# Patient Record
Sex: Male | Born: 1985 | Race: Black or African American | Hispanic: No | Marital: Single | Smoking: Never smoker
Health system: Southern US, Community
[De-identification: ages and names within clinical notes are randomized; demographics above are authoritative.]

## PROBLEM LIST (undated history)

## (undated) DIAGNOSIS — R569 Unspecified convulsions: Secondary | ICD-10-CM

## (undated) DIAGNOSIS — I10 Essential (primary) hypertension: Secondary | ICD-10-CM

## (undated) HISTORY — DX: Unspecified convulsions: R56.9

## (undated) HISTORY — DX: Essential (primary) hypertension: I10

## (undated) HISTORY — PX: OTHER SURGICAL HISTORY: SHX169

---
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
## 2005-09-20 ENCOUNTER — Emergency Department (HOSPITAL_COMMUNITY): Admission: EM | Admit: 2005-09-20 | Discharge: 2005-09-20 | Payer: Self-pay | Admitting: Emergency Medicine

## 2005-10-02 ENCOUNTER — Emergency Department (HOSPITAL_COMMUNITY): Admission: EM | Admit: 2005-10-02 | Discharge: 2005-10-02 | Payer: Self-pay | Admitting: Emergency Medicine

## 2006-02-06 ENCOUNTER — Emergency Department (HOSPITAL_COMMUNITY): Admission: EM | Admit: 2006-02-06 | Discharge: 2006-02-06 | Payer: Self-pay | Admitting: Emergency Medicine

## 2010-07-24 ENCOUNTER — Emergency Department (HOSPITAL_COMMUNITY)
Admission: EM | Admit: 2010-07-24 | Discharge: 2010-07-24 | Disposition: A | Discharge status: Home / Self Care | Payer: BC Managed Care – PPO | Attending: Emergency Medicine | Admitting: Emergency Medicine

## 2010-07-24 DIAGNOSIS — F29 Unspecified psychosis not due to a substance or known physiological condition: Secondary | ICD-10-CM | POA: Insufficient documentation

## 2010-07-24 DIAGNOSIS — G40909 Epilepsy, unspecified, not intractable, without status epilepticus: Secondary | ICD-10-CM | POA: Insufficient documentation

## 2010-07-24 LAB — VALPROIC ACID LEVEL: Valproic Acid Lvl: 36.4 ug/mL — ABNORMAL LOW (ref 50.0–100.0)

## 2011-05-31 ENCOUNTER — Emergency Department (HOSPITAL_COMMUNITY)
Admission: EM | Admit: 2011-05-31 | Discharge: 2011-05-31 | Disposition: A | Discharge status: Home / Self Care | Payer: BC Managed Care – PPO | Attending: Emergency Medicine | Admitting: Emergency Medicine

## 2011-05-31 ENCOUNTER — Emergency Department (HOSPITAL_COMMUNITY): Payer: BC Managed Care – PPO

## 2011-05-31 DIAGNOSIS — IMO0001 Reserved for inherently not codable concepts without codable children: Secondary | ICD-10-CM | POA: Insufficient documentation

## 2011-05-31 DIAGNOSIS — X500XXA Overexertion from strenuous movement or load, initial encounter: Secondary | ICD-10-CM | POA: Insufficient documentation

## 2011-05-31 DIAGNOSIS — R071 Chest pain on breathing: Secondary | ICD-10-CM | POA: Insufficient documentation

## 2011-05-31 DIAGNOSIS — R0789 Other chest pain: Secondary | ICD-10-CM

## 2011-05-31 MED ORDER — HYDROCODONE-ACETAMINOPHEN 5-325 MG PO TABS: ORAL_TABLET | ORAL | Status: AC

## 2011-05-31 MED ORDER — IBUPROFEN 800 MG PO TABS: 800.0000 mg | ORAL_TABLET | Freq: Three times a day (TID) | ORAL | Status: AC | PRN

## 2011-05-31 MED ORDER — IBUPROFEN 800 MG PO TABS
800.0000 mg | ORAL_TABLET | Freq: Once | ORAL | Status: AC
  Administered 2011-05-31: 800 mg via ORAL
  Filled 2011-05-31: qty 1

## 2011-05-31 NOTE — Discharge Instructions (Signed)
Please read and follow all provided instructions.  Your diagnoses today include:  1. Chest wall pain     Tests performed today include:  X-ray of shoulder - normal  Vital signs. See below for your results today.   Medications prescribed:   Vicodin (hydrocodone/acetaminophen) - narcotic pain medication  You have been prescribed narcotic pain medication such as Vicodin or Percocet: DO NOT drive or perform any activities that require you to be awake and alert because this medicine can make you drowsy. BE VERY CAREFUL not to take multiple medicines containing Tylenol (also called acetaminophen). Doing so can lead to an overdose which can damage your liver and cause liver failure and possibly death.    Ibuprofen - anti-inflammatory pain medication  Do not exceed 800mg  ibuprofen every 8 hours  You have been prescribed an anti-inflammatory medication or NSAID. Take with food. Take smallest effective dose for the shortest duration needed for your pain. Stop taking if you experience stomach pain or vomiting.   Take any prescribed medications only as directed.  Home care instructions:  Follow any educational materials contained in this packet.  BE VERY CAREFUL not to take multiple medicines containing Tylenol (also called acetaminophen). Doing so can lead to an overdose which can damage your liver and cause liver failure and possibly death.   Follow-up instructions: Follow-up with orthopedic referral in next 7 days.   Return instructions:   Please return to the Emergency Department if you experience worsening symptoms.   Please return if you have any other emergent concerns.  Additional Information:  Your vital signs today were: BP 172/81  Pulse 71  Temp(Src) 98.1 F (36.7 C) (Oral)  Resp 18  SpO2 98% If your blood pressure (BP) was elevated above 135/85 this visit, please have this repeated by your doctor within one month. -------------- No Primary Care Doctor Call Health  Connect  [phone] Other agencies that provide inexpensive medical care    [facility]  [phone]    [facility]  [phone]    [facility]  [phone]    [facility]  [phone]    [facility]  [phone]    [facility]  [phone] -------------- RESOURCE GUIDE:  Dental Problems  Patients with Medicaid: [facility] [phone] [address]                                            [phone] [address] Phone:  [phone]                                                   Phone:  [phone]  If unable to pay or uninsured, contact:  [facility] or [facility]. to become qualified for the adult dental clinic.  Chronic Pain Problems Contact [facility]  [phone] Patients need to be referred by their primary care doctor.  Insufficient Money for Medicine Contact [employer]:  call "211" or [facility] [phone].  Psychological Services [employer] [facility]  [phone] [facility]  [phone] [facility]  [phone] (emergency services [phone])  Substance Abuse Resources [facility]  [phone] [facility] [phone] [facility] [phone] [facility] [phone] Residential & Outpatient Substance Abuse Program  [phone]  Abuse/Neglect [facility] Child Abuse Hotline [phone]) 202-1300 [facility] Child Abuse Hotline (915) 835-8724 (After Hours)  Emergency Shelter [facility] [phone]  Maternity Homes Room at the [place] [facility] [phone] [facility] [phone]  [facility] Resources  [facility]     [employer]                          [facility] Dept. [address] [place]                       [address]      [address] [place] [address]  Blondell Reveal Phone:  [phone]                                   Phone:  [phone]                 Phone:  [phone]  [facility] Phone:  [phone]  [facility] Child Abuse Hotline 339-368-1458 [phone] (After Hours)

## 2011-05-31 NOTE — ED Provider Notes (Signed)
History     CSN: [id number]  Arrival date & time 05/31/11  1717   First MD Initiated Contact with Patient 05/31/11 2002      Chief Complaint  Patient presents with  . Sore    (Consider location/radiation/quality/duration/timing/severity/associated sxs/prior treatment) HPI Comments: Patient was bench pressing when he felt tearing sensation in L lateral chest and anterior shoulder. This occurred approx 3 hrs ago. Complains of a dull, aching pain that does not radiate. Pain is constant. Movement of arm makes pain worse. Nothing makes it better. No treatments prior to arrival.   The history is provided by the patient.    No past medical history on file.  No past surgical history on file.  No family history on file.  History  Substance Use Topics  . Smoking status: Not on file  . Smokeless tobacco: Not on file  . Alcohol Use: Not on file      Review of Systems  Constitutional: Negative for fever and chills.  HENT: Negative for neck pain.   Respiratory: Negative for shortness of breath.   Cardiovascular: Positive for chest pain (chest wall).  Gastrointestinal: Negative for nausea and vomiting.  Musculoskeletal: Positive for myalgias. Negative for back pain and arthralgias.  Skin: Negative for wound.  Neurological: Negative for weakness and numbness.    Allergies  Review of patient's allergies indicates no known allergies.  Home Medications   Current Outpatient Rx  Name Route Sig Dispense Refill  . VITAMIN C 1000 MG PO TABS Oral Take 1,000 mg by mouth daily.    Marland Kitchen DIVALPROEX SODIUM ER 500 MG PO TB24 Oral Take 500 mg by mouth 2 (two) times daily.      BP 159/80  Pulse 89  Temp(Src) 98.3 F (36.8 C) (Oral)  Resp 14  SpO2 99%  Physical Exam  Nursing note and vitals reviewed. Constitutional: He is oriented to person, place, and time. He appears well-developed and well-nourished.  HENT:  Head: Normocephalic and atraumatic.  Eyes: Conjunctivae are normal.    Neck: Normal range of motion. Neck supple.  Cardiovascular: Normal pulses.   Pulses:      Radial pulses are 2+ on the right side, and 2+ on the left side.  Pulmonary/Chest: Breath sounds normal. No respiratory distress. He exhibits tenderness.  Abdominal: Soft. There is no tenderness.  Musculoskeletal: He exhibits tenderness. He exhibits no edema.       Left shoulder: He exhibits tenderness (anterior ). He exhibits normal range of motion and no bony tenderness.       Left elbow: Normal. He exhibits normal range of motion.       Cervical back: Normal. He exhibits normal range of motion.       Arms: Neurological: He is alert and oriented to person, place, and time. No sensory deficit.       Motor, sensation, and vascular distal to the injury is fully intact.   Skin: Skin is warm and dry.  Psychiatric: He has a normal mood and affect.    ED Course  Procedures (including critical care time)  Labs Reviewed - No data to display Dg Shoulder Left  05/31/2011  *RADIOLOGY REPORT*  Clinical Data: Left chest injury while weight lifting with possible pectoralis muscle injury and focal pain and swelling  LEFT SHOULDER - 2+ VIEW  Comparison: None.  Findings: No dislocation or fracture is evident.  Left lung apex is clear.  AC joint distance is normal allowing for nonweightbearing technique.  IMPRESSION: Normal exam.  No osseous abnormality.  Original Report Authenticated By: Harrel Lemon, M.D.     1. Chest wall pain     8:18 PM Patient seen and examined. Work-up initiated. Medications ordered.   Vital signs reviewed and are as follows: Filed Vitals:   05/31/11 1736  BP: 159/80  Pulse: 89  Temp: 98.3 F (36.8 C)  Resp: 14   Patient informed of x-ray results. Counseled on RICE, d/c lifting until healed. Orthopedic follow-up given.   Patient counseled on use of narcotic pain medications. Counseled not to combine these medications with others containing tylenol. Urged not to drink  alcohol, drive, or perform any other activities that requires focus while taking these medications. The patient verbalizes understanding and agrees with the plan.   MDM  Suspect pectoralis tear given exam and history. Ortho f/u and pain control, RICE indicated.         Renne Crigler, [place] 06/01/11 [phone]

## 2011-05-31 NOTE — ED Notes (Signed)
Wt. Lifting, and believed torn muscle to lt.arm - "felt everything collapsed." some numbness tingling in hand for short time, and now feeling of pain in lt. Chest wall pain and lt. Shoulder pain. Pt. Bench pressing.

## 2011-05-31 NOTE — Progress Notes (Signed)
Orthopedic Tech Progress Note Patient Details:  Nicholas Harvey 1985-07-15 [id number]  Other Ortho Devices Type of Ortho Device: Other (comment) Ortho Device Location: left arm Ortho Device Interventions: Application Foam arm sling  [name] 05/31/2011, 10:53 PM

## 2011-06-01 NOTE — ED Provider Notes (Signed)
Medical screening examination/treatment/procedure(s) were performed by non-physician practitioner and as supervising physician I was immediately available for consultation/collaboration.   Gerhard Munch, MD 06/01/11 1755

## 2012-04-08 ENCOUNTER — Other Ambulatory Visit: Payer: Self-pay | Admitting: Neurology

## 2012-04-11 ENCOUNTER — Ambulatory Visit (INDEPENDENT_AMBULATORY_CARE_PROVIDER_SITE_OTHER): Payer: BC Managed Care – PPO | Admitting: Family Medicine

## 2012-04-11 VITALS — BP 140/90 | HR 86 | Temp 98.0°F | Resp 16 | Ht 72.5 in | Wt 230.0 lb

## 2012-04-11 DIAGNOSIS — R569 Unspecified convulsions: Secondary | ICD-10-CM

## 2012-04-11 MED ORDER — DIVALPROEX SODIUM ER 500 MG PO TB24: 500.0000 mg | ORAL_TABLET | Freq: Two times a day (BID) | ORAL | Status: DC

## 2012-04-11 NOTE — Patient Instructions (Signed)
See neurologist for all future refills.

## 2012-04-11 NOTE — Progress Notes (Signed)
Subjective: Last seizure July 2012. Ran out 4 days ago.  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  Needs rx to carry him over.  Objective: Healthy appearing young man. NAD.   Assessment: Seizure disorder  Plan: RF x 1

## 2012-06-25 IMAGING — CR DG SHOULDER 2+V*L*
3 series · 3 of 3 positions shown · non-contrast
Comparison: None.

CLINICAL DATA: Left chest injury while weight lifting with possible
pectoralis muscle injury and focal pain and swelling

LEFT SHOULDER - 2+ VIEW

[w shoulder ap internal left]
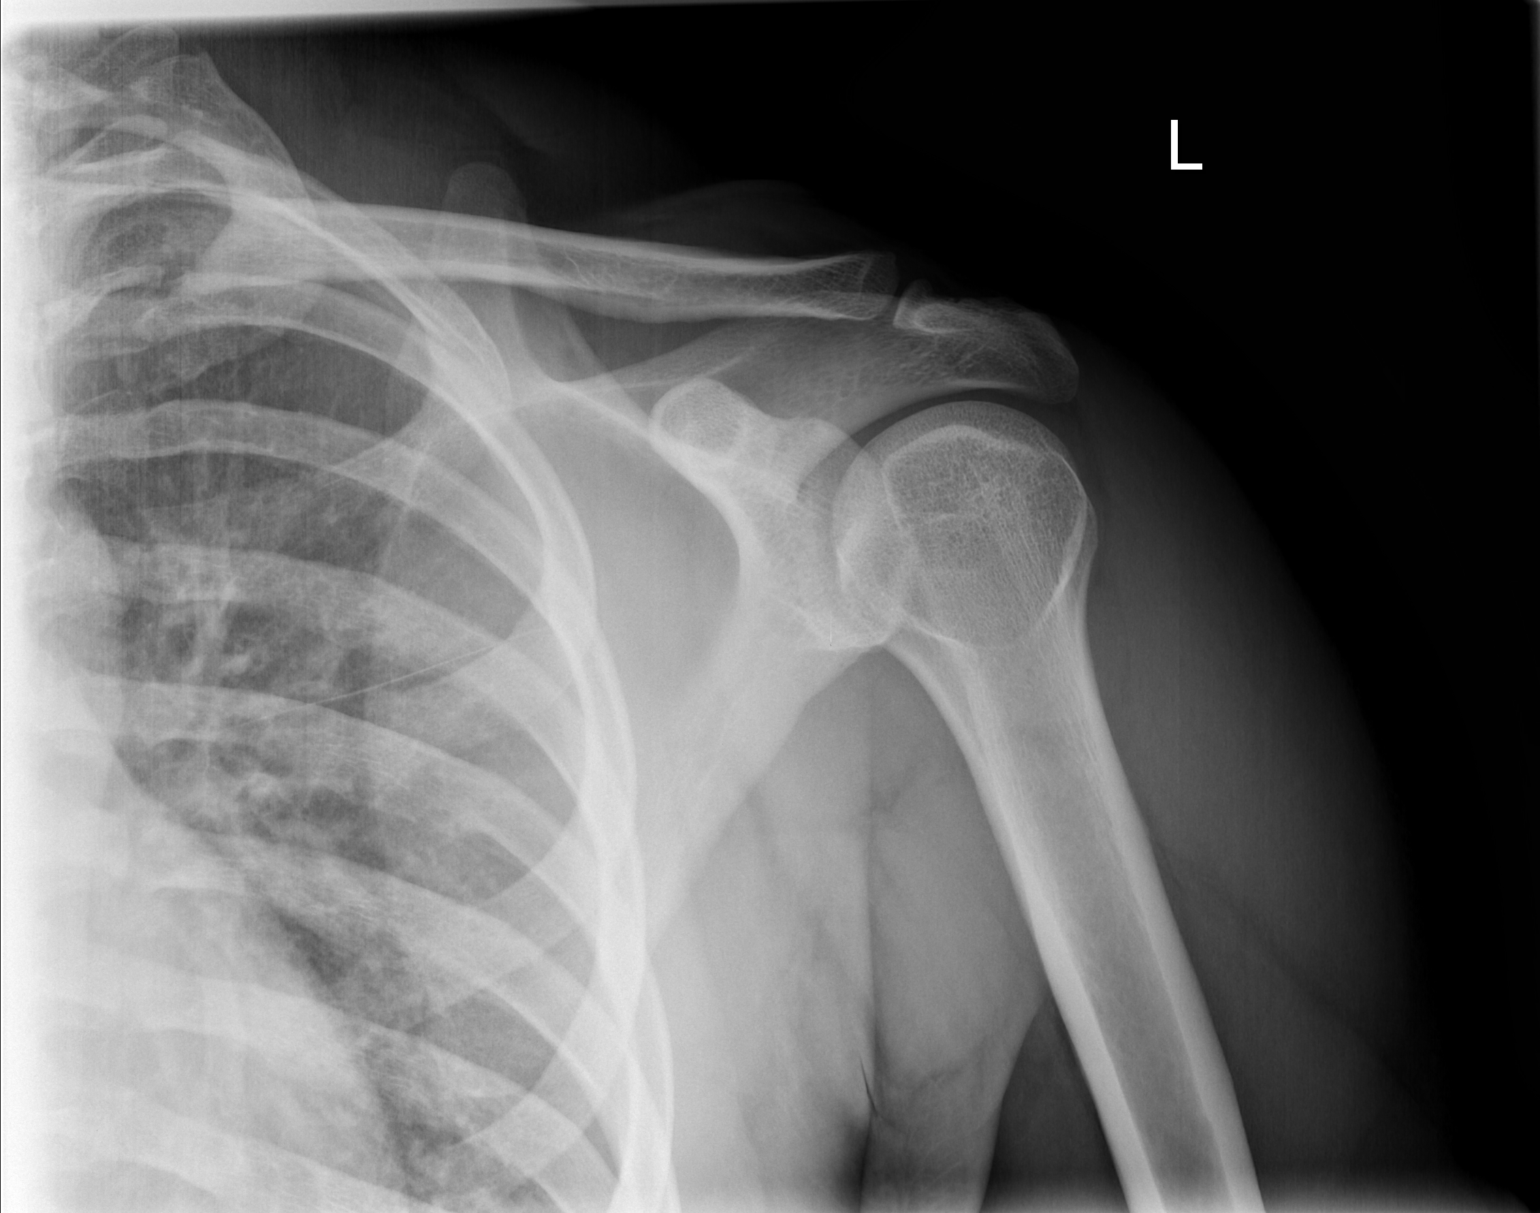

[w shoulder ap external left]
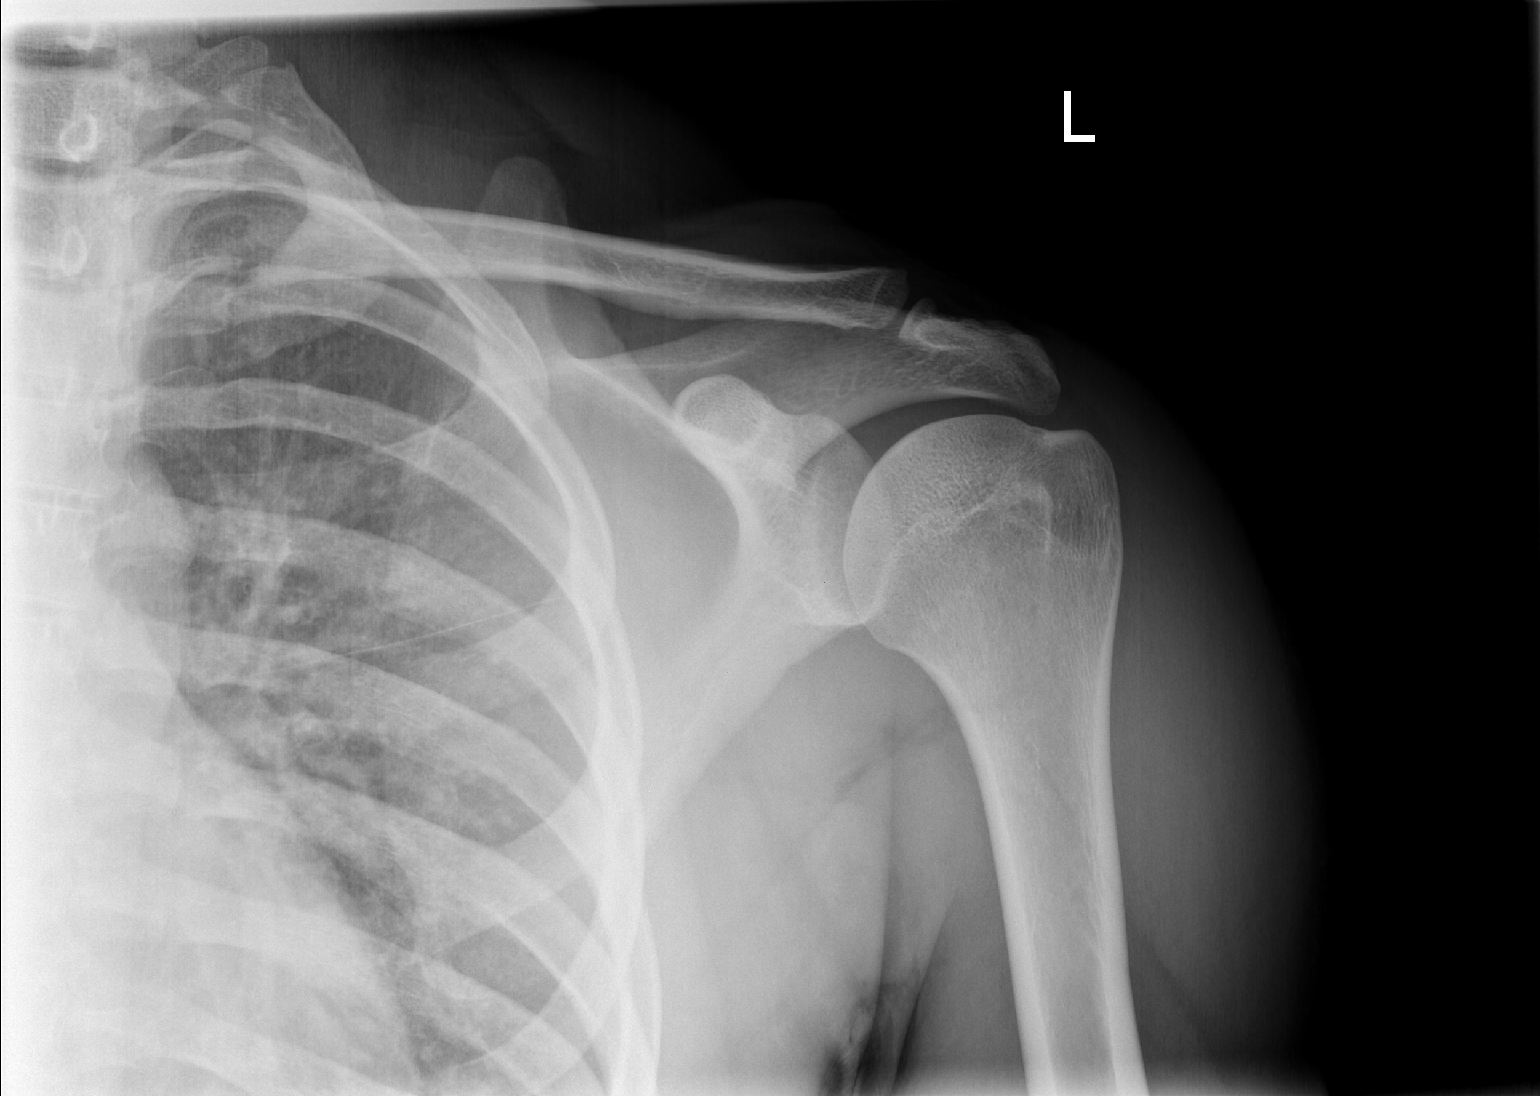

[w shoulder y view left]
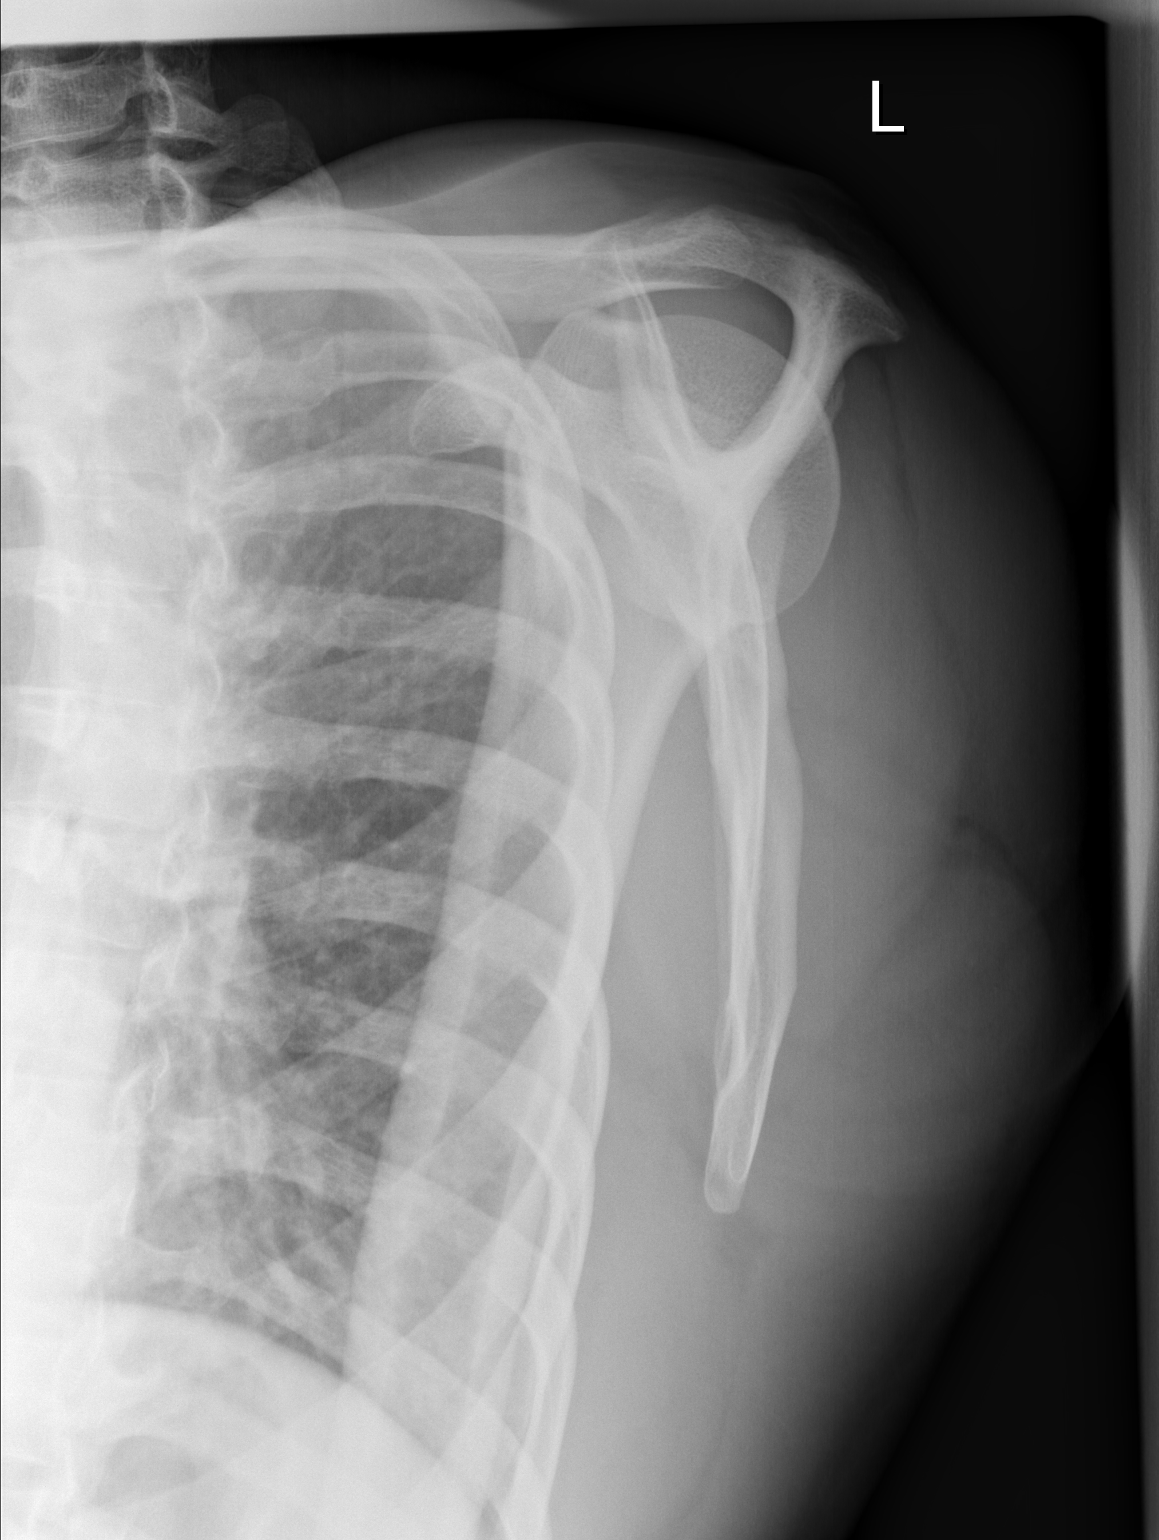

[3 of 3 positions shown; findings below may reference images not displayed]

FINDINGS: No dislocation or fracture is evident.  Left lung apex is
clear.  AC joint distance is normal allowing for nonweightbearing
technique.
IMPRESSION: Normal exam.  No osseous abnormality.

## 2012-07-03 ENCOUNTER — Encounter: Payer: Self-pay | Admitting: Nurse Practitioner

## 2012-07-03 ENCOUNTER — Ambulatory Visit (INDEPENDENT_AMBULATORY_CARE_PROVIDER_SITE_OTHER): Payer: BC Managed Care – PPO | Admitting: Nurse Practitioner

## 2012-07-03 VITALS — BP 130/80 | HR 71 | Ht 73.0 in | Wt 236.0 lb

## 2012-07-03 DIAGNOSIS — G40309 Generalized idiopathic epilepsy and epileptic syndromes, not intractable, without status epilepticus: Secondary | ICD-10-CM

## 2012-07-03 DIAGNOSIS — Z5181 Encounter for therapeutic drug level monitoring: Secondary | ICD-10-CM | POA: Insufficient documentation

## 2012-07-03 DIAGNOSIS — R569 Unspecified convulsions: Secondary | ICD-10-CM

## 2012-07-03 DIAGNOSIS — Z79899 Other long term (current) drug therapy: Secondary | ICD-10-CM

## 2012-07-03 MED ORDER — DIVALPROEX SODIUM ER 500 MG PO TB24: 500.0000 mg | ORAL_TABLET | Freq: Two times a day (BID) | ORAL | Status: DC

## 2012-07-03 NOTE — Patient Instructions (Addendum)
Continue Depakote at current dose, will refill for one year CBC, CMP, and  Depakote level today Followup yearly and when necessary

## 2012-07-03 NOTE — Progress Notes (Signed)
HPI: Patient returns for followup last visit 12/25/2010. 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 He is a previous patient of  Dr. Thad Ranger who has left the practice. He started having seizures around 2003, which he describes as generalized convulsions with associated loss of consciousness and postictal lethargy and confusion. He has been on Depakote for several years for his seizures, and his last known seizure was in July 2012 because he had run out of medication for several days. Outside MRI and EEG were unremarkable.  He returns today saying he is doing well. He denies any  problems with his Depakote.He claims he is compliant with medication regime.  ROS:  Not enough  sleep  Physical Exam General: well developed, well nourished, seated, in no evident distress Head: head normocephalic and atraumatic. Oropharynx benign Neck: supple with no carotid  bruits Cardiovascular: regular rate and rhythm, no murmurs  Neurologic Exam Mental Status: Awake and fully alert. Oriented to place and time. Follows all commands. Speech and language normal.   Cranial Nerves: Fundoscopic exam reveals sharp disc margins. Pupils equal, briskly reactive to light. Extraocular movements full without nystagmus. Visual fields full to confrontation. Hearing intact and symmetric to finger snap. Facial sensation intact. Face, tongue, palate move normally and symmetrically. Neck flexion and extension normal.  Motor: Normal bulk and tone. Normal strength in all tested extremity muscles.No focal weakness Sensory.: intact to touch and pinprick and vibratory.  Coordination: Rapid alternating movements normal in all extremities. Finger-to-nose and heel-to-shin performed accurately bilaterally. Gait and Station: Arises from chair without difficulty. Stance is normal. Gait demonstrates normal stride length and balance . Able to heel, toe and tandem walk without difficulty.  Reflexes: 2+  and symmetric. Toes downgoing.     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

## 2012-07-04 LAB — CBC WITH DIFFERENTIAL
Basos: 1 % (ref 0–3)
Eos: 5 % (ref 0–5)
Eosinophils Absolute: 0.3 10*3/uL (ref 0.0–0.4)
Hemoglobin: 14.8 g/dL (ref 12.6–17.7)
Lymphs: 37 % (ref 14–46)
Neutrophils Relative %: 45 % (ref 40–74)
Platelets: 127 10*3/uL — ABNORMAL LOW (ref 150–379)
RBC: 5 x10E6/uL (ref 4.14–5.80)

## 2012-07-04 LAB — COMPREHENSIVE METABOLIC PANEL
Albumin: 4.4 g/dL (ref 3.5–5.5)
Alkaline Phosphatase: 53 IU/L (ref 39–117)
BUN/Creatinine Ratio: 14 (ref 8–19)
BUN: 12 mg/dL (ref 6–20)
CO2: 24 mmol/L (ref 18–29)
Creatinine, Ser: 0.85 mg/dL (ref 0.76–1.27)
GFR calc Af Amer: 137 mL/min/{1.73_m2} (ref >59)
Globulin, Total: 2.3 g/dL (ref 1.5–4.5)
Glucose: 84 mg/dL (ref 65–99)
Total Protein: 6.7 g/dL (ref 6.0–8.5)

## 2012-07-04 LAB — VALPROIC ACID LEVEL: Valproic Acid Lvl: 41 ug/mL — ABNORMAL LOW (ref 50–100)

## 2012-07-06 ENCOUNTER — Other Ambulatory Visit: Payer: Self-pay | Admitting: Nurse Practitioner

## 2013-07-05 ENCOUNTER — Encounter: Payer: Self-pay | Admitting: Nurse Practitioner

## 2013-07-05 ENCOUNTER — Ambulatory Visit (INDEPENDENT_AMBULATORY_CARE_PROVIDER_SITE_OTHER): Payer: BC Managed Care – PPO | Admitting: Nurse Practitioner

## 2013-07-05 VITALS — BP 141/83 | HR 98 | Ht 73.5 in | Wt 245.0 lb

## 2013-07-05 DIAGNOSIS — Z79899 Other long term (current) drug therapy: Secondary | ICD-10-CM

## 2013-07-05 DIAGNOSIS — R569 Unspecified convulsions: Secondary | ICD-10-CM

## 2013-07-05 DIAGNOSIS — G40309 Generalized idiopathic epilepsy and epileptic syndromes, not intractable, without status epilepticus: Secondary | ICD-10-CM

## 2013-07-05 LAB — CBC WITH DIFFERENTIAL/PLATELET
BASOS ABS: 0 10*3/uL (ref 0.0–0.2)
Basos: 0 %
EOS ABS: 0.1 10*3/uL (ref 0.0–0.4)
Eos: 3 %
HCT: 42.8 % (ref 37.5–51.0)
Hemoglobin: 14.7 g/dL (ref 12.6–17.7)
LYMPHS: 32 %
Lymphocytes Absolute: 1.5 10*3/uL (ref 0.7–3.1)
MCH: 29.5 pg (ref 26.6–33.0)
MCHC: 34.3 g/dL (ref 31.5–35.7)
MCV: 86 fL (ref 79–97)
MONOS ABS: 0.7 10*3/uL (ref 0.1–0.9)
Monocytes: 15 %
NEUTROS PCT: 48 %
Neutrophils Absolute: 2.4 10*3/uL (ref 1.4–7.0)
RBC: 4.98 x10E6/uL (ref 4.14–5.80)
RDW: 14 % (ref 12.3–15.4)
WBC: 4.8 10*3/uL (ref 3.4–10.8)

## 2013-07-05 LAB — VALPROIC ACID LEVEL: VALPROIC ACID LVL: 58 ug/mL (ref 50–100)

## 2013-07-05 LAB — COMPREHENSIVE METABOLIC PANEL
ALT: 65 IU/L — AB (ref 0–44)
AST: 41 IU/L — ABNORMAL HIGH (ref 0–40)
Albumin/Globulin Ratio: 1.5 (ref 1.1–2.5)
Albumin: 4.3 g/dL (ref 3.5–5.5)
Alkaline Phosphatase: 62 IU/L (ref 39–117)
BILIRUBIN TOTAL: 0.6 mg/dL (ref 0.0–1.2)
BUN/Creatinine Ratio: 16 (ref 8–19)
BUN: 13 mg/dL (ref 6–20)
CALCIUM: 10.1 mg/dL (ref 8.7–10.2)
CHLORIDE: 102 mmol/L (ref 96–108)
CO2: 24 mmol/L (ref 18–29)
Creatinine, Ser: 0.82 mg/dL (ref 0.76–1.27)
GFR calc Af Amer: 138 mL/min/{1.73_m2} (ref >59)
GFR calc non Af Amer: 119 mL/min/{1.73_m2} (ref >59)
GLUCOSE: 85 mg/dL (ref 65–99)
Globulin, Total: 2.9 g/dL (ref 1.5–4.5)
POTASSIUM: 4.3 mmol/L (ref 3.5–5.2)
Sodium: 135 mmol/L (ref 134–144)
TOTAL PROTEIN: 7.2 g/dL (ref 6.0–8.5)

## 2013-07-05 LAB — IMMATURE CELLS: Bands(Auto) Relative: 2 %

## 2013-07-05 MED ORDER — DIVALPROEX SODIUM ER 500 MG PO TB24: 500.0000 mg | ORAL_TABLET | Freq: Two times a day (BID) | ORAL | Status: DC

## 2013-07-05 NOTE — Progress Notes (Signed)
[facility]  PATIENT: Nicholas Harvey DOB: 10/20/1985   REASON FOR VISIT: Followup for seizure disorder    HISTORY OF PRESENT ILLNESS: [name], 28 year old male returns for yearly followup. 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  He started having seizures around 2003, which he describes as generalized convulsions with associated loss of consciousness and postictal lethargy and confusion. He has been on Depakote for several years for his seizures, and his last known seizure was in July 2012 because he had run out of medication for several days. Outside MRI and EEG were unremarkable. He returns today saying he is doing well. He denies any problems with his Depakote.He claims he is compliant with medication regime. Platelet level was low at last visit, he did not get it rechecked as requested.      REVIEW OF SYSTEMS: Full 14 system review of systems performed and notable only for those listed, all others are neg:  Constitutional: N/A  Cardiovascular: N/A  Ear/Nose/Throat: N/A  Skin: N/A  Eyes: N/A  Respiratory: N/A  Gastroitestinal: N/A  Hematology/Lymphatic: N/A  Endocrine: N/A Musculoskeletal: Back pain Allergy/Immunology: N/A  Neurological: N/A Psychiatric: N/A Sleep : NA   ALLERGIES: No Known Allergies  HOME MEDICATIONS: Outpatient Prescriptions Prior to Visit  Medication Sig Dispense Refill  . divalproex (DEPAKOTE ER) 500 MG 24 hr tablet Take 1 tablet (500 mg total) by mouth 2 (two) times daily.  60 tablet  11  . Ascorbic Acid (VITAMIN C) 1000 MG tablet Take 1,000 mg by mouth daily.       No facility-administered medications prior to visit.    PAST MEDICAL HISTORY: Past Medical History  Diagnosis Date  . Seizures     PAST SURGICAL HISTORY: Past Surgical History  Procedure Laterality Date  . None      FAMILY HISTORY: Family History  Problem Relation Age of Onset  . Seizures Mother   .  Asthma Mother   . Hypertension Mother   . Emphysema Mother   . Hypertension Father   . Asthma Sister   . Hypertension Maternal Grandmother   . Kidney disease Maternal Grandmother   . Hypertension Paternal Grandmother   . Kidney disease Paternal Grandfather     SOCIAL HISTORY: History   Social History  . Marital Status: Single    Spouse Name: Nicholas Harvey     Number of Children: 0  . Years of Education: Masters   Occupational History  . 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     Social History Main Topics  . Smoking status: Never Smoker   . Smokeless tobacco: Never Used  . Alcohol Use: Yes     Comment: 2-3 cans beer a week  . Drug Use: No  . Sexual Activity: Yes    Partners: Female    Birth Control/ Protection: Condom   Other Topics Concern  . Not on file   Social History Narrative   Patient lives at home with Nicholas Harvey girlfriend.    Patient has no children.    Patient has his Masters in education.    Patient is right handed.    Patient is currently working for [employer].            PHYSICAL EXAM  Filed Vitals:   07/05/13 1006  BP: 141/83  Pulse: 98  Height: 6' 1.5" (1.867 m)  Weight: 245 lb (111.131 kg)   Body mass index is 31.88 kg/(m^2).  General:  well developed, well nourished, seated, in no evident distress  Head: head normocephalic and atraumatic. Oropharynx benign  Neck: supple     Neurologic Exam  Mental Status: Awake and fully alert. Oriented to place and time. Follows all commands. Speech and language normal.  Cranial Nerves:  Pupils equal, briskly reactive to light. Extraocular movements full without nystagmus. Visual fields full to confrontation. Hearing intact and symmetric to finger snap. Facial sensation intact. Face, tongue, palate move normally and symmetrically. Neck flexion and extension normal.  Motor: Normal bulk and tone. Normal strength in all tested extremity muscles.No focal weakness  Coordination:  Rapid alternating movements normal in all extremities. Finger-to-nose and heel-to-shin performed accurately bilaterally.  Gait and Station: Arises from chair without difficulty. Stance is normal. Gait demonstrates normal stride length and balance . Able to heel, toe and tandem walk without difficulty.  Reflexes: 2+ and symmetric. Toes downgoing.    ASSESSMENT AND PLAN  28 y.o. year old male  has a past medical history of Seizures, here to followup. Last seizure in 2012.   Continue Depakote at current dose will refill Check labs today Followup yearly and when necessary Nilda RiggsNancy Carolyn Martin, [facility] CenterGNP, [facility], APRN  [facility] [address] [place], [place] [address] [phone]

## 2013-07-05 NOTE — Patient Instructions (Signed)
Continue Depakote at current dose will refill Check labs today Followup yearly and when necessary 

## 2013-07-06 NOTE — Progress Notes (Signed)
Quick Note:  LMVM for pt on home, that labs looked good per Enid Skeens. Martin, NP. Pt to call back if questions. ______

## 2013-12-07 ENCOUNTER — Encounter: Payer: Self-pay | Admitting: Nurse Practitioner

## 2014-03-01 ENCOUNTER — Encounter: Payer: BC Managed Care – PPO | Attending: Family Medicine | Admitting: Dietician

## 2014-03-01 ENCOUNTER — Encounter: Payer: Self-pay | Admitting: Dietician

## 2014-03-01 VITALS — Ht 72.0 in | Wt 251.0 lb

## 2014-03-01 DIAGNOSIS — Z6834 Body mass index (BMI) 34.0-34.9, adult: Secondary | ICD-10-CM | POA: Diagnosis not present

## 2014-03-01 DIAGNOSIS — Z713 Dietary counseling and surveillance: Secondary | ICD-10-CM | POA: Diagnosis present

## 2014-03-01 DIAGNOSIS — E669 Obesity, unspecified: Secondary | ICD-10-CM | POA: Insufficient documentation

## 2014-03-01 NOTE — Progress Notes (Signed)
  Medical Nutrition Therapy:  Appt start time: 1740 end time:  1825.   Assessment:  Primary concerns today: Nicholas Harvey is here today since he would like to lose weight (200 lbs) and would like to live a healthier lifestyle. 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 Got injured and stopped working out. Feels like he is eating "garbage meals" and was drinking more alcohol. Blood pressure has been elevated lately and hypertension runs in his family.    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 Lives with his wife and his wife does the food shopping and they cook together. His wife is also interested in eating healthier. They would like to be healthier in order to have kids. Does not eat 3 meals per day and will skip any meal. Eats out about 5-7 meals per week.   Goes to sporting events for kids at school and active in his church group. Has a busy schedule.  Having trouble sleeping.   Preferred Learning Style:   No preference indicated   Learning Readiness:   Ready  MEDICATIONS: see list    DIETARY INTAKE:  Usual eating pattern includes 2 meals and 0-2 snacks per day.  Avoided foods include: none  24-hr recall:  B ( AM): sausage egg and cheese biscuit with coffee with creamer and sweetener   Snk ( AM): none  L ( PM): cafeteria or if brings from home a ham/turkey sandwich with chips with water or tea Snk ( PM):might stop at [employer] on the way home and get a kids meal 2 chicken supreme with biscuit, fries, and a coke D ( PM): restaurant meal or if cooks will have pasta, rice, grilled chicken or tuna with water Snk ( PM): problem time - snacking on popcorn, chips and dip, coffee (3-4 cups) Beverages:coffee, water, tea, soft drinks sometimes  Usual physical activity: works out every once in a while  Estimated energy needs: 2000 calories 225 g carbohydrates 150 g protein 56 g fat  Progress Towards Goal(s):  In progress.   Nutritional  Diagnosis:  [place]-3.3 Overweight/obesity As related to hx of meal skipping and frequent restaurant meals.  As evidenced by BMI of 34.0.    Intervention:  Nutrition counseling provided. Plan: Plan to go to the gym 4-5 x weeks. Aim to eat 3 meals per day and have 2 snacks if you are hungry. Try to go to bed at the same time each night (11 PM). Start relaxing/avoiding electronics at 10 PM. Avoid caffeine after 1 PM. Aim to fill half of your plate with vegetables at lunch and dinner (bagged salad or frozen vegetables). Have protein about the size of the palm of your hand and starch for quarter of your plate. Plan to have 2 restaurant meals per week.  Have protein and carbs together for snacks. Keeps snacks at work. (see list).  Drink more water (have your Yeti with you). Take some time on Sunday to plan out meals for the week.   Teaching Method Utilized:  Visual Auditory Hands on  Handouts given during visit include:  MyPlate Handout  15 g CHO Snacks  Yellow Card  Barriers to learning/adherence to lifestyle change: busy schedule  Demonstrated degree of understanding via:  Teach Back   Monitoring/Evaluation:  Dietary intake, exercise, and body weight in 2 month(s).

## 2014-03-01 NOTE — Patient Instructions (Signed)
Plan to go to the gym 4-5 x weeks. Aim to eat 3 meals per day and have 2 snacks if you are hungry. Try to go to bed at the same time each night (11 PM). Start relaxing/avoiding electronics at 10 PM. Avoid caffeine after 1 PM. Aim to fill half of your plate with vegetables at lunch and dinner (bagged salad or frozen vegetables). Have protein about the size of the palm of your hand and starch for quarter of your plate. Plan to have 2 restaurant meals per week.  Have protein and carbs together for snacks. Keeps snacks at work. (see list).  Drink more water (have your Yeti with you). Take some time on Sunday to plan out meals for the week.

## 2014-05-02 ENCOUNTER — Encounter: Payer: BC Managed Care – PPO | Attending: Family Medicine | Admitting: Dietician

## 2014-05-02 VITALS — Ht 72.0 in | Wt 250.6 lb

## 2014-05-02 DIAGNOSIS — Z6834 Body mass index (BMI) 34.0-34.9, adult: Secondary | ICD-10-CM | POA: Diagnosis not present

## 2014-05-02 DIAGNOSIS — E669 Obesity, unspecified: Secondary | ICD-10-CM | POA: Insufficient documentation

## 2014-05-02 DIAGNOSIS — Z713 Dietary counseling and surveillance: Secondary | ICD-10-CM | POA: Insufficient documentation

## 2014-05-02 NOTE — Progress Notes (Signed)
  Medical Nutrition Therapy:  Appt start time: 1730 end time:  1745.   Assessment:  Primary concerns today: Nicholas Harvey retuns with 1 lbs weight loss. Has moved (bought a house) since last visit which has "thrown him off". He and his wife are cooking more at home now that they are settling in. Had to join a new gym. Was going to gym early in the morning and felt good doing it. Would like to get back into a routine.   Preferred Learning Style:   No preference indicated   Learning Readiness:   Ready  MEDICATIONS: see list    DIETARY INTAKE:  Usual eating pattern includes 2 meals and 0-2 snacks per day.  Avoided foods include: none  24-hr recall:  B ( AM): boiled egg with banana sometimes with coffee Snk ( AM): none  L ( PM): cafeteria or if brings from home tuna and broccoli Snk ( PM):sometimes fast food D ( PM): restaurant meal or if cooks will have pasta, rice, grilled chicken or tuna with water Snk ( PM): problem time - snacking on popcorn, chips and dip coffee 3-4 cups (getting better) Beverages:coffee, water, tea, soft drinks sometimes  Usual physical activity: had to join a new gym, was working out regularly    Estimated energy needs: 2000 calories 225 g carbohydrates 150 g protein 56 g fat  Progress Towards Goal(s):  In progress.   Nutritional Diagnosis:  [place]-3.3 Overweight/obesity As related to hx of meal skipping and frequent restaurant meals.  As evidenced by BMI of 34.0.    Intervention:  Nutrition counseling provided. Plan: Plan to go to the gym 4-5 x weeks. Aim to eat 3 meals per day and have 2 snacks if you are hungry. Try to go to bed at the same time each night (11 PM). Start relaxing/avoiding electronics at 10 PM. Continue having vegetables at lunch and dinner. Think about adding fruit to lunch.  Have protein about the size of the palm of your hand and starch for quarter of your plate. Plan to have 2 restaurant meals per week (weekends).  Have protein and  carbs together for snacks. Keeps snacks at work. (see list).  Drink more water (think about having a reminder in phone). Take some time on Sunday to plan out meals for the week.   Teaching Method Utilized:  Visual Auditory Hands on  Handouts given during visit include:  15 g CHO Snacks   Barriers to learning/adherence to lifestyle change: busy schedule  Demonstrated degree of understanding via:  Teach Back   Monitoring/Evaluation:  Dietary intake, exercise, and body weight in 2 month(s).

## 2014-05-02 NOTE — Patient Instructions (Addendum)
Plan to go to the gym 4-5 x weeks. Aim to eat 3 meals per day and have 2 snacks if you are hungry. Try to go to bed at the same time each night (11 PM). Start relaxing/avoiding electronics at 10 PM. Continue having vegetables at lunch and dinner. Think about adding fruit to lunch.  Have protein about the size of the palm of your hand and starch for quarter of your plate. Plan to have 2 restaurant meals per week (weekends).  Have protein and carbs together for snacks. Keeps snacks at work. (see list).  Drink more water (think about having a reminder in phone). Take some time on Sunday to plan out meals for the week.

## 2014-07-04 ENCOUNTER — Ambulatory Visit: Payer: BC Managed Care – PPO | Admitting: Dietician

## 2014-07-06 ENCOUNTER — Ambulatory Visit: Payer: BC Managed Care – PPO | Admitting: Nurse Practitioner

## 2014-07-06 ENCOUNTER — Ambulatory Visit (INDEPENDENT_AMBULATORY_CARE_PROVIDER_SITE_OTHER): Payer: BC Managed Care – PPO | Admitting: Nurse Practitioner

## 2014-07-06 ENCOUNTER — Encounter: Payer: Self-pay | Admitting: Nurse Practitioner

## 2014-07-06 VITALS — BP 143/84 | HR 89 | Ht 72.0 in | Wt 248.6 lb

## 2014-07-06 DIAGNOSIS — Z5181 Encounter for therapeutic drug level monitoring: Secondary | ICD-10-CM | POA: Diagnosis not present

## 2014-07-06 DIAGNOSIS — R569 Unspecified convulsions: Secondary | ICD-10-CM | POA: Diagnosis not present

## 2014-07-06 DIAGNOSIS — G40309 Generalized idiopathic epilepsy and epileptic syndromes, not intractable, without status epilepticus: Secondary | ICD-10-CM | POA: Diagnosis not present

## 2014-07-06 MED ORDER — DIVALPROEX SODIUM ER 500 MG PO TB24: 500.0000 mg | ORAL_TABLET | Freq: Two times a day (BID) | ORAL | Status: DC

## 2014-07-06 NOTE — Patient Instructions (Signed)
Continue Depakote at current dose will refill for one year Check labs today Follow-up yearly and when necessary

## 2014-07-06 NOTE — Progress Notes (Signed)
[facility]  PATIENT: Nicholas Harvey DOB: Jun 29, 1985   REASON FOR VISIT: Follow-up for seizure disorder HISTORY FROM: Patient    HISTORY OF PRESENT ILLNESS:Nicholas Harvey, 29year-old male returns for yearly followup. 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 He started having seizures around 2003, which he describes as generalized convulsions with associated loss of consciousness and postictal lethargy and confusion. He has been on Depakote for several years for his seizures, and his last known seizure was in November 2015 he was  out of medication for several days. Outside MRI and EEG were unremarkable. He returns today saying he is doing well. He denies any problems with his Depakote.He claims he is compliant with medication regime. He needs medication refills and labs.     REVIEW OF SYSTEMS: Full 14 system review of systems performed and notable only for those listed, all others are neg:  Constitutional: neg  Cardiovascular: neg Ear/Nose/Throat: neg  Skin: neg Eyes: neg Respiratory: neg Gastroitestinal: neg  Hematology/Lymphatic: neg  Endocrine: neg Musculoskeletal:neg Allergy/Immunology: neg Neurological: Seizure disorder Psychiatric: neg Sleep : neg   ALLERGIES: No Known Allergies  HOME MEDICATIONS: Outpatient Prescriptions Prior to Visit  Medication Sig Dispense Refill  . divalproex (DEPAKOTE ER) 500 MG 24 hr tablet Take 1 tablet (500 mg total) by mouth 2 (two) times daily. 60 tablet 11  . Multiple Vitamins-Minerals (MULTIVITAMIN & MINERAL PO) Take by mouth.    . Omega-3 Fatty Acids (FISH OIL EXTRA STRENGTH PO) Take by mouth.     No facility-administered medications prior to visit.    PAST MEDICAL HISTORY: Past Medical History  Diagnosis Date  . Seizures   . Hypertension     PAST SURGICAL HISTORY: Past Surgical History  Procedure Laterality Date  . None      FAMILY HISTORY: Family History    Problem Relation Age of Onset  . Seizures Mother   . Asthma Mother   . Hypertension Mother   . Emphysema Mother   . Hypertension Father   . Asthma Sister   . Hypertension Maternal Grandmother   . Kidney disease Maternal Grandmother   . Hypertension Paternal Grandmother   . Kidney disease Paternal Grandfather     SOCIAL HISTORY: History   Social History  . Marital Status: Single    Spouse Name: Nicholas Harvey   . Number of Children: 0  . Years of Education: Masters   Occupational History  . 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     Social History Main Topics  . Smoking status: Never Smoker   . Smokeless tobacco: Never Used  . Alcohol Use: Yes     Comment: 2-3 cans beer a week  . Drug Use: No  . Sexual Activity:    Partners: Female    Pharmacist, hospital Protection: Condom   Other Topics Concern  . Not on file   Social History Narrative   Patient lives at home with Nicholas Harvey wife.    Patient has no children.    Patient has his Masters in education.    Patient is right handed.    Patient is currently working for [employer].            PHYSICAL EXAM  Filed Vitals:   07/06/14 1340  BP: 143/84  Pulse: 89  Height: 6' (1.829 m)  Weight: 248 lb 9.6 oz (112.764 kg)   Body mass index is 33.71 kg/(m^2). General: well developed, obese male well nourished, seated,  in no evident distress  Head: head normocephalic and atraumatic. Oropharynx benign  Neck: supple   Neurologic Exam  Mental Status: Awake and fully alert. Oriented to place and time. Follows all commands. Speech and language normal.  Cranial Nerves: Pupils equal, briskly reactive to light. Extraocular movements full without nystagmus. Visual fields full to confrontation. Hearing intact and symmetric to finger snap. Facial sensation intact. Face, tongue, palate move normally and symmetrically. Neck flexion and extension normal.  Motor: Normal bulk and tone. Normal strength in all  tested extremity muscles.No focal weakness  Coordination: Rapid alternating movements normal in all extremities. Finger-to-nose and heel-to-shin performed accurately bilaterally.  Gait and Station: Arises from chair without difficulty. Stance is normal. Gait demonstrates normal stride length and balance . Able to heel, toe and tandem walk without difficulty.  Reflexes: 2+ and symmetric. Toes downgoing.   DIAGNOSTIC DATA (LABS, IMAGING, TESTING) - ASSESSMENT AND PLAN  29 y.o. year old male  has a past medical history of Seizures and Hypertension, here to follow-up for his seizure disorder. Last seizure occurred in November 2015 after missing several doses of medication. He is back on medication at this point.  Continue Depakote at current dose will refill for one year Check labs today, CBC, CMP and VPA level Follow-up yearly and when necessary Nicholas Harvey, [facility], [facility], APRN  [facility] [address], [address] [place], [place] [address] [phone]

## 2014-07-07 ENCOUNTER — Other Ambulatory Visit: Payer: Self-pay | Admitting: *Deleted

## 2014-07-07 DIAGNOSIS — G40309 Generalized idiopathic epilepsy and epileptic syndromes, not intractable, without status epilepticus: Secondary | ICD-10-CM

## 2014-07-07 LAB — COMPREHENSIVE METABOLIC PANEL
ALK PHOS: 62 IU/L (ref 39–117)
ALT: 21 IU/L (ref 0–44)
AST: 24 IU/L (ref 0–40)
Albumin/Globulin Ratio: 1.6 (ref 1.1–2.5)
Albumin: 4.3 g/dL (ref 3.5–5.5)
BUN/Creatinine Ratio: 14 (ref 8–19)
BUN: 12 mg/dL (ref 6–20)
Bilirubin Total: 0.7 mg/dL (ref 0.0–1.2)
CALCIUM: 9.2 mg/dL (ref 8.7–10.2)
CHLORIDE: 102 mmol/L (ref 97–108)
CO2: 22 mmol/L (ref 18–29)
CREATININE: 0.88 mg/dL (ref 0.76–1.27)
GFR calc Af Amer: 133 mL/min/{1.73_m2} (ref >59)
GFR, EST NON AFRICAN AMERICAN: 115 mL/min/{1.73_m2} (ref >59)
GLOBULIN, TOTAL: 2.7 g/dL (ref 1.5–4.5)
GLUCOSE: 89 mg/dL (ref 65–99)
POTASSIUM: 4 mmol/L (ref 3.5–5.2)
SODIUM: 140 mmol/L (ref 134–144)
Total Protein: 7 g/dL (ref 6.0–8.5)

## 2014-07-07 LAB — CBC WITH DIFFERENTIAL/PLATELET
BASOS ABS: 0 10*3/uL (ref 0.0–0.2)
Basos: 0 %
EOS (ABSOLUTE): 0.1 10*3/uL (ref 0.0–0.4)
EOS: 2 %
Hematocrit: 41.3 % (ref 37.5–51.0)
Hemoglobin: 13.5 g/dL (ref 12.6–17.7)
IMMATURE GRANULOCYTES: 0 %
Immature Grans (Abs): 0 10*3/uL (ref 0.0–0.1)
LYMPHS ABS: 2.6 10*3/uL (ref 0.7–3.1)
Lymphs: 43 %
MCH: 28.7 pg (ref 26.6–33.0)
MCHC: 32.7 g/dL (ref 31.5–35.7)
MCV: 88 fL (ref 79–97)
MONOS ABS: 0.6 10*3/uL (ref 0.1–0.9)
Monocytes: 10 %
Neutrophils Absolute: 2.7 10*3/uL (ref 1.4–7.0)
Neutrophils: 45 %
PLATELETS: 137 10*3/uL — AB (ref 150–379)
RBC: 4.7 x10E6/uL (ref 4.14–5.80)
RDW: 14.4 % (ref 12.3–15.4)
WBC: 6.1 10*3/uL (ref 3.4–10.8)

## 2014-07-07 LAB — VALPROIC ACID LEVEL: VALPROIC ACID LVL: 78 ug/mL (ref 50–100)

## 2014-07-07 NOTE — Progress Notes (Signed)
Quick Note:  I called and spoke to pt about lab results. (normal, low plts. To repeat in one month). Will mail requisition to him as reminder to have done. He verbalized understanding. ______

## 2014-07-12 NOTE — Progress Notes (Signed)
I have reviewed and agreed above plan. 

## 2014-08-10 ENCOUNTER — Ambulatory Visit: Payer: BC Managed Care – PPO | Admitting: Dietician

## 2014-08-30 ENCOUNTER — Other Ambulatory Visit (INDEPENDENT_AMBULATORY_CARE_PROVIDER_SITE_OTHER): Payer: Self-pay

## 2014-08-30 DIAGNOSIS — Z0289 Encounter for other administrative examinations: Secondary | ICD-10-CM

## 2014-08-30 DIAGNOSIS — G40309 Generalized idiopathic epilepsy and epileptic syndromes, not intractable, without status epilepticus: Secondary | ICD-10-CM

## 2014-08-31 ENCOUNTER — Telehealth: Payer: Self-pay | Admitting: *Deleted

## 2014-08-31 LAB — CBC WITH DIFFERENTIAL/PLATELET
BASOS: 0 %
Basophils Absolute: 0 10*3/uL (ref 0.0–0.2)
EOS (ABSOLUTE): 0.1 10*3/uL (ref 0.0–0.4)
EOS: 2 %
Hematocrit: 43.7 % (ref 37.5–51.0)
Hemoglobin: 14.6 g/dL (ref 12.6–17.7)
IMMATURE GRANS (ABS): 0 10*3/uL (ref 0.0–0.1)
IMMATURE GRANULOCYTES: 0 %
LYMPHS: 48 %
Lymphocytes Absolute: 2.3 10*3/uL (ref 0.7–3.1)
MCH: 28.7 pg (ref 26.6–33.0)
MCHC: 33.4 g/dL (ref 31.5–35.7)
MCV: 86 fL (ref 79–97)
MONOS ABS: 0.5 10*3/uL (ref 0.1–0.9)
Monocytes: 10 %
NEUTROS PCT: 40 %
Neutrophils Absolute: 1.9 10*3/uL (ref 1.4–7.0)
PLATELETS: 158 10*3/uL (ref 150–379)
RBC: 5.08 x10E6/uL (ref 4.14–5.80)
RDW: 13.9 % (ref 12.3–15.4)
WBC: 4.7 10*3/uL (ref 3.4–10.8)

## 2014-08-31 NOTE — Telephone Encounter (Signed)
-----   Message from Nilda Riggs, NP sent at 08/31/2014  9:04 AM EDT ----- Platelets back to normal please call the patient

## 2014-08-31 NOTE — Progress Notes (Signed)
Quick Note:  I called pt and relayed that platelet count normal. He verbalized understanding. ______

## 2014-08-31 NOTE — Telephone Encounter (Signed)
I spoke to pt and lab results normal (platelet count back to normal).  He verbalized understanding.

## 2015-07-06 ENCOUNTER — Ambulatory Visit (INDEPENDENT_AMBULATORY_CARE_PROVIDER_SITE_OTHER): Payer: BC Managed Care – PPO | Admitting: Nurse Practitioner

## 2015-07-06 ENCOUNTER — Encounter: Payer: Self-pay | Admitting: Nurse Practitioner

## 2015-07-06 VITALS — BP 138/90 | HR 88 | Ht 72.0 in | Wt 251.8 lb

## 2015-07-06 DIAGNOSIS — G40309 Generalized idiopathic epilepsy and epileptic syndromes, not intractable, without status epilepticus: Secondary | ICD-10-CM

## 2015-07-06 DIAGNOSIS — R569 Unspecified convulsions: Secondary | ICD-10-CM | POA: Diagnosis not present

## 2015-07-06 DIAGNOSIS — Z5181 Encounter for therapeutic drug level monitoring: Secondary | ICD-10-CM

## 2015-07-06 MED ORDER — DIVALPROEX SODIUM ER 500 MG PO TB24: 500.0000 mg | ORAL_TABLET | Freq: Two times a day (BID) | ORAL | Status: DC

## 2015-07-06 NOTE — Progress Notes (Signed)
[facility]  PATIENT: Nicholas Harvey DOB: 08/08/1985   REASON FOR VISIT  follow-up for generalized seizure disorder HISTORY FROM: Patient    HISTORY OF PRESENT ILLNESS:Nicholas Harvey, [age]year-old male returns for yearly followup. 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 He started having seizures around 2003, which he describes as generalized convulsions with associated loss of consciousness and postictal lethargy and confusion. He has been on Depakote for several years for his seizures, and his last known seizure was in November 2015 he was out of medication for several days. Outside MRI and EEG were unremarkable. He returns today saying he is doing well. He denies any problems with his Depakote.He claims he is compliant with medication regime. He needs medication refills and labs. No new neurologic complaints   REVIEW OF SYSTEMS: Full 14 system review of systems performed and notable only for those listed, all others are neg:  Constitutional: neg  Cardiovascular: neg Ear/Nose/Throat: neg  Skin: neg Eyes: neg Respiratory: neg Gastroitestinal: neg  Hematology/Lymphatic: neg  Endocrine: neg Musculoskeletal:neg Allergy/Immunology: neg Neurological: neg Psychiatric: neg Sleep : neg   ALLERGIES: No Known Allergies  HOME MEDICATIONS: Outpatient Prescriptions Prior to Visit  Medication Sig Dispense Refill  . divalproex (DEPAKOTE ER) 500 MG 24 hr tablet Take 1 tablet (500 mg total) by mouth 2 (two) times daily. 60 tablet 11  . Multiple Vitamins-Minerals (MULTIVITAMIN & MINERAL PO) Take by mouth.    . Omega-3 Fatty Acids (FISH OIL EXTRA STRENGTH PO) Take by mouth.     No facility-administered medications prior to visit.    PAST MEDICAL HISTORY: Past Medical History  Diagnosis Date  . Seizures (HCC)   . Hypertension     PAST SURGICAL HISTORY: Past Surgical History  Procedure Laterality Date  . None       FAMILY HISTORY: Family History  Problem Relation Age of Onset  . Seizures Mother   . Asthma Mother   . Hypertension Mother   . Emphysema Mother   . Hypertension Father   . Asthma Sister   . Hypertension Maternal Grandmother   . Kidney disease Maternal Grandmother   . Hypertension Paternal Grandmother   . Kidney disease Paternal Grandfather     SOCIAL HISTORY: Social History   Social History  . Marital Status: Single    Spouse Name: Nicholas Harvey   . Number of Children: 0  . Years of Education: Masters   Occupational History  . 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     Social History Main Topics  . Smoking status: Never Smoker   . Smokeless tobacco: Never Used  . Alcohol Use: Yes     Comment: 2-3 cans beer a week  . Drug Use: No  . Sexual Activity:    Partners: Female    Pharmacist, hospitalBirth Control/ Protection: Condom   Other Topics Concern  . Not on file   Social History Narrative   Patient lives at home with Nicholas Harvey girlfriend.    Patient has no children.    Patient has his Masters in education.    Patient is right handed.    Patient is currently working for [employer].            PHYSICAL EXAM  Filed Vitals:   07/06/15 1323  BP: 138/90  Pulse: 88  Height: 6' (1.829 m)  Weight: 251 lb 12.8 oz (114.216 kg)   Body mass index is 34.14 kg/(m^2). General: well developed, obese  male well nourished, seated, in no evident distress  Head: head normocephalic and atraumatic. Oropharynx benign  Neck: supple   Neurologic Exam  Mental Status: Awake and fully alert. Oriented to place and time. Follows all commands. Speech and language normal.  Cranial Nerves: Pupils equal, briskly reactive to light. Extraocular movements full without nystagmus. Visual fields full to confrontation. Hearing intact and symmetric to finger snap. Facial sensation intact. Face, tongue, palate move normally and symmetrically. Neck flexion and extension  normal.  Motor: Normal bulk and tone. Normal strength in all tested extremity muscles.No focal weakness  Coordination: Rapid alternating movements normal in all extremities. Finger-to-nose and heel-to-shin performed accurately bilaterally.  Gait and Station: Arises from chair without difficulty. Stance is normal. Gait demonstrates normal stride length and balance . Able to heel, toe and tandem walk without difficulty.  Reflexes: 2+ and symmetric. Toes downgoing.    DIAGNOSTIC DATA (LABS, IMAGING, TESTING) -   ASSESSMENT AND PLAN 30 y.o. year old male has a past medical history of Seizures and Hypertension, here to follow-up for his seizure disorder. Last seizure occurred in November 2015 after missing several doses of medication.   Continue Depakote at current dose will refill for one year Check labs today, CBC, CMP and VPA level Follow-up yearly and when necessary Call for any seizure activity Nilda RiggsNancy Carolyn Shaquna Geigle, [facility]GNP, [facility], APRN  [facility] [address], [address] [place], [place] [address] [phone]

## 2015-07-06 NOTE — Patient Instructions (Signed)
Continue Depakote at current dose will refill for one year Check labs today, CBC, CMP and VPA level Follow-up yearly and when necessary

## 2015-07-07 ENCOUNTER — Telehealth: Payer: Self-pay | Admitting: *Deleted

## 2015-07-07 LAB — CBC WITH DIFFERENTIAL/PLATELET
BASOS: 0 %
Basophils Absolute: 0 10*3/uL (ref 0.0–0.2)
EOS (ABSOLUTE): 0.1 10*3/uL (ref 0.0–0.4)
EOS: 2 %
HEMATOCRIT: 38.8 % (ref 37.5–51.0)
HEMOGLOBIN: 13 g/dL (ref 12.6–17.7)
Immature Grans (Abs): 0 10*3/uL (ref 0.0–0.1)
Immature Granulocytes: 0 %
LYMPHS ABS: 2.4 10*3/uL (ref 0.7–3.1)
Lymphs: 46 %
MCH: 28.6 pg (ref 26.6–33.0)
MCHC: 33.5 g/dL (ref 31.5–35.7)
MCV: 86 fL (ref 79–97)
MONOCYTES: 8 %
MONOS ABS: 0.4 10*3/uL (ref 0.1–0.9)
NEUTROS ABS: 2.4 10*3/uL (ref 1.4–7.0)
Neutrophils: 44 %
Platelets: 143 10*3/uL — ABNORMAL LOW (ref 150–379)
RBC: 4.54 x10E6/uL (ref 4.14–5.80)
RDW: 14.4 % (ref 12.3–15.4)
WBC: 5.4 10*3/uL (ref 3.4–10.8)

## 2015-07-07 LAB — COMPREHENSIVE METABOLIC PANEL
ALBUMIN: 4.3 g/dL (ref 3.5–5.5)
ALK PHOS: 52 IU/L (ref 39–117)
ALT: 22 IU/L (ref 0–44)
AST: 27 IU/L (ref 0–40)
Albumin/Globulin Ratio: 1.6 (ref 1.2–2.2)
BUN / CREAT RATIO: 14 (ref 9–20)
BUN: 13 mg/dL (ref 6–20)
Bilirubin Total: 0.6 mg/dL (ref 0.0–1.2)
CO2: 23 mmol/L (ref 18–29)
CREATININE: 0.96 mg/dL (ref 0.76–1.27)
Calcium: 9.3 mg/dL (ref 8.7–10.2)
Chloride: 101 mmol/L (ref 96–106)
GFR, EST AFRICAN AMERICAN: 121 mL/min/{1.73_m2} (ref >59)
GFR, EST NON AFRICAN AMERICAN: 105 mL/min/{1.73_m2} (ref >59)
GLOBULIN, TOTAL: 2.7 g/dL (ref 1.5–4.5)
GLUCOSE: 90 mg/dL (ref 65–99)
Potassium: 4.2 mmol/L (ref 3.5–5.2)
SODIUM: 140 mmol/L (ref 134–144)
TOTAL PROTEIN: 7 g/dL (ref 6.0–8.5)

## 2015-07-07 LAB — VALPROIC ACID LEVEL: Valproic Acid Lvl: 46 ug/mL — ABNORMAL LOW (ref 50–100)

## 2015-07-07 NOTE — Telephone Encounter (Signed)
I spoke to pt and relayed the lab results.   VPA a little low.  No changes in dosing.   Take every day, no skipping doses.   He verbalized understanding.

## 2015-07-07 NOTE — Telephone Encounter (Signed)
I LMVM for pt to return call for lab results.  Here Friday till 1200, or will connect early next week.

## 2015-07-07 NOTE — Telephone Encounter (Signed)
-----   Message from Nilda RiggsNancy Carolyn Martin, NP sent at 07/07/2015  8:04 AM EDT ----- VPA a little low. I am not going to change dose at this time. Please make sure you are taking meds.Do not skip doses. Please call

## 2016-07-11 ENCOUNTER — Encounter: Payer: Self-pay | Admitting: Nurse Practitioner

## 2016-07-11 ENCOUNTER — Encounter (INDEPENDENT_AMBULATORY_CARE_PROVIDER_SITE_OTHER): Payer: Self-pay

## 2016-07-11 ENCOUNTER — Ambulatory Visit (INDEPENDENT_AMBULATORY_CARE_PROVIDER_SITE_OTHER): Payer: BC Managed Care – PPO | Admitting: Nurse Practitioner

## 2016-07-11 VITALS — BP 143/91 | HR 90 | Ht 72.0 in | Wt 244.0 lb

## 2016-07-11 DIAGNOSIS — R569 Unspecified convulsions: Secondary | ICD-10-CM

## 2016-07-11 DIAGNOSIS — G40309 Generalized idiopathic epilepsy and epileptic syndromes, not intractable, without status epilepticus: Secondary | ICD-10-CM

## 2016-07-11 DIAGNOSIS — Z5181 Encounter for therapeutic drug level monitoring: Secondary | ICD-10-CM | POA: Diagnosis not present

## 2016-07-11 MED ORDER — DIVALPROEX SODIUM ER 500 MG PO TB24: 500.0000 mg | ORAL_TABLET | Freq: Two times a day (BID) | ORAL | 3 refills | Status: DC

## 2016-07-11 NOTE — Patient Instructions (Signed)
Continue Depakote at current dose will refill for one year Check labs today, CBC, CMP and VPA level Follow-up yearly and when necessary Call for any seizure activity

## 2016-07-11 NOTE — Progress Notes (Signed)
[facility]  PATIENT: Nicholas Harvey DOB: 1985/08/18   REASON FOR VISIT  follow-up for generalized seizure disorder HISTORY FROM: Patient    HISTORY OF PRESENT ILLNESS:Nicholas Harvey, 31year-old male returns for yearly followup. 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 He started having seizures around 2003, which he describes as generalized convulsions with associated loss of consciousness and postictal lethargy and confusion. He has been on Depakote for several years for his seizures, and his last known seizure was in November 2015 he was out of medication for several days. Outside MRI and EEG were unremarkable. He returns today saying he is doing well. He denies any problems with his Depakote.He claims he is compliant with medication regime. He needs medication refills and labs. No new neurologic complaints. 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    REVIEW OF SYSTEMS: Full 14 system review of systems performed and notable only for those listed, all others are neg:  Constitutional: neg  Cardiovascular: neg Ear/Nose/Throat: neg  Skin: neg Eyes: neg Respiratory: neg Gastroitestinal: neg  Hematology/Lymphatic: neg  Endocrine: neg Musculoskeletal:neg Allergy/Immunology: neg Neurological: Seizure disorder Psychiatric: neg Sleep : neg   ALLERGIES: No Known Allergies  HOME MEDICATIONS: Outpatient Medications Prior to Visit  Medication Sig Dispense Refill  . divalproex (DEPAKOTE ER) 500 MG 24 hr tablet Take 1 tablet (500 mg total) by mouth 2 (two) times daily. 60 tablet 11  . Omega-3 Fatty Acids (FISH OIL EXTRA STRENGTH PO) Take by mouth.    . Multiple Vitamins-Minerals (MULTIVITAMIN & MINERAL PO) Take by mouth.     No facility-administered medications prior to visit.     PAST MEDICAL HISTORY: Past Medical History:  Diagnosis Date  . Hypertension   . Seizures (HCC)     PAST SURGICAL HISTORY: Past  Surgical History:  Procedure Laterality Date  . NONE      FAMILY HISTORY: Family History  Problem Relation Age of Onset  . Seizures Mother   . Asthma Mother   . Hypertension Mother   . Emphysema Mother   . Hypertension Father   . Asthma Sister   . Hypertension Maternal Grandmother   . Kidney disease Maternal Grandmother   . Hypertension Paternal Grandmother   . Kidney disease Paternal Grandfather     SOCIAL HISTORY: Social History   Social History  . Marital status: Single    Spouse name: Victorino Dike   . Number of children: 0  . Years of education: Masters   Occupational History  . 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  [employer]   Social History Main Topics  . Smoking status: Never Smoker  . Smokeless tobacco: Never Used  . Alcohol use Yes     Comment: 2-3 cans beer a week  . Drug use: No  . Sexual activity: Yes    Partners: Female    Birth control/ protection: Condom   Other Topics Concern  . Not on file   Social History Narrative   Patient lives at home with Verl Dicker girlfriend.    Patient has no children.    Patient has his Masters in education.    Patient is right handed.    Patient is currently working for [employer].            PHYSICAL EXAM  Vitals:   07/11/16 1323  BP: (!) 143/91  Pulse: 90  Weight: 244 lb (110.7 kg)  Height: 6' (1.829 m)  Body mass index is 33.09 kg/m. General: well developed, obese male well nourished, seated, in no evident distress  Head: head normocephalic and atraumatic. Oropharynx benign  Neck: supple   Neurologic Exam  Mental Status: Awake and fully alert. Oriented to place and time. Follows all commands. Speech and language normal.  Cranial Nerves: Pupils equal, briskly reactive to light. Extraocular movements full without nystagmus. Visual fields full to confrontation. Hearing intact and symmetric to finger snap. Facial sensation intact. Face, tongue, palate  move normally and symmetrically. Neck flexion and extension normal.  Motor: Normal bulk and tone. Normal strength in all tested extremity muscles.No focal weakness  Coordination: Rapid alternating movements normal in all extremities. Finger-to-nose and heel-to-shin performed accurately bilaterally.  Gait and Station: Arises from chair without difficulty. Stance is normal. Gait demonstrates normal stride length and balance . Able to heel, toe and tandem walk without difficulty.  Reflexes: 2+ and symmetric. Toes downgoing.    DIAGNOSTIC DATA (LABS, IMAGING, TESTING) -   ASSESSMENT AND PLAN 31 y.o. year old male has a past medical history of Seizures and Hypertension, here to follow-up for his seizure disorder. Last seizure occurred in November 2015 after missing several doses of medication. The patient is a current patient of Dr. Terrace ArabiaYan  who is out of the office today . This note is sent to the work in doctor.     Continue Depakote at current dose will refill for one year Check labs today, CBC, CMP for adverse effects of Depakote  VPA level to therapeutic level/ toxicity Follow-up yearly and when necessary Call for any seizure activity Nilda RiggsNancy Carolyn Millianna Szymborski, [facility]GNP, [facility]BC, APRN  [facility] [address], [address] [place], [place] [address] [phone]

## 2016-07-12 ENCOUNTER — Other Ambulatory Visit: Payer: Self-pay | Admitting: Nurse Practitioner

## 2016-07-12 DIAGNOSIS — R569 Unspecified convulsions: Secondary | ICD-10-CM

## 2016-07-12 LAB — CBC WITH DIFFERENTIAL/PLATELET
BASOS ABS: 0 10*3/uL (ref 0.0–0.2)
Basos: 0 %
EOS (ABSOLUTE): 0.2 10*3/uL (ref 0.0–0.4)
Eos: 3 %
HEMATOCRIT: 42.3 % (ref 37.5–51.0)
HEMOGLOBIN: 13.8 g/dL (ref 13.0–17.7)
Immature Grans (Abs): 0 10*3/uL (ref 0.0–0.1)
Immature Granulocytes: 0 %
LYMPHS ABS: 2.5 10*3/uL (ref 0.7–3.1)
Lymphs: 46 %
MCH: 28.2 pg (ref 26.6–33.0)
MCHC: 32.6 g/dL (ref 31.5–35.7)
MCV: 87 fL (ref 79–97)
MONOCYTES: 9 %
Monocytes Absolute: 0.5 10*3/uL (ref 0.1–0.9)
Neutrophils Absolute: 2.3 10*3/uL (ref 1.4–7.0)
Neutrophils: 42 %
Platelets: 194 10*3/uL (ref 150–379)
RBC: 4.89 x10E6/uL (ref 4.14–5.80)
RDW: 14.5 % (ref 12.3–15.4)
WBC: 5.4 10*3/uL (ref 3.4–10.8)

## 2016-07-12 LAB — COMPREHENSIVE METABOLIC PANEL
A/G RATIO: 1.8 (ref 1.2–2.2)
ALBUMIN: 4.6 g/dL (ref 3.5–5.5)
ALK PHOS: 60 IU/L (ref 39–117)
ALT: 19 IU/L (ref 0–44)
AST: 20 IU/L (ref 0–40)
BILIRUBIN TOTAL: 0.7 mg/dL (ref 0.0–1.2)
BUN / CREAT RATIO: 11 (ref 9–20)
BUN: 10 mg/dL (ref 6–20)
CHLORIDE: 102 mmol/L (ref 96–106)
CO2: 26 mmol/L (ref 20–29)
Calcium: 9.6 mg/dL (ref 8.7–10.2)
Creatinine, Ser: 0.93 mg/dL (ref 0.76–1.27)
GFR calc Af Amer: 125 mL/min/{1.73_m2} (ref >59)
GFR calc non Af Amer: 108 mL/min/{1.73_m2} (ref >59)
GLOBULIN, TOTAL: 2.6 g/dL (ref 1.5–4.5)
Glucose: 77 mg/dL (ref 65–99)
Potassium: 4.5 mmol/L (ref 3.5–5.2)
SODIUM: 142 mmol/L (ref 134–144)
TOTAL PROTEIN: 7.2 g/dL (ref 6.0–8.5)

## 2016-07-12 LAB — VALPROIC ACID LEVEL: VALPROIC ACID LVL: 46 ug/mL — AB (ref 50–100)

## 2016-07-15 ENCOUNTER — Telehealth: Payer: Self-pay | Admitting: *Deleted

## 2016-07-15 NOTE — Telephone Encounter (Signed)
LVM informing the patient his labs are stable. Left number for any questions. Phone staff may relay message.

## 2017-03-06 ENCOUNTER — Other Ambulatory Visit: Payer: Self-pay | Admitting: Nurse Practitioner

## 2017-03-06 NOTE — Telephone Encounter (Signed)
Pt called divalproex (DEPAKOTE ER) 500 MG 24 hr tablet will cost him $600 for a 90 day supply. Pt's insurance has not changed, [facility]. He is aware RN will call when PA is approved

## 2017-03-07 NOTE — Telephone Encounter (Signed)
Called [facility] and spoke with Earley AbideHilda who stated that drug cost is $1599 for 90 days. With patient's  insurance and a discount coupon, his cost is  $634.15 for 90 days. She stated the insurance they have on file for patient is not BCBS (which this office has). It is [facility]. She stated that [facility].  Called patient, left detailed VM advising him of Hilda's recommendation. Advised he take current insurance card and bottle of [facility]ote to [facility] to have them price it.  Also advised him this office needs copy of current insurance as well. Informed him this office is now closed, opens Mon at 8 am, left number.

## 2017-03-10 NOTE — Telephone Encounter (Signed)
LMVM for pt if he contacted [facility] about [facility] cost of [facility]ote and to let us know if needed anything.

## 2017-03-12 ENCOUNTER — Telehealth: Payer: Self-pay | Admitting: *Deleted

## 2017-03-12 DIAGNOSIS — R569 Unspecified convulsions: Secondary | ICD-10-CM

## 2017-03-12 NOTE — Telephone Encounter (Signed)
Pt called and is almost out of medication.  Is having to pt out of pocket $900. For his medication (90 day supply).  He is asking what to do.  He has new BCBS plan [facility] [phone].  ? On generic.

## 2017-03-13 MED ORDER — DIVALPROEX SODIUM ER 500 MG PO TB24: 500.0000 mg | ORAL_TABLET | Freq: Two times a day (BID) | ORAL | 3 refills | Status: DC

## 2017-03-13 NOTE — Telephone Encounter (Addendum)
Have attempted to contact pt at all 3 # listed.  Have not been able to connect with.  He is able to use generic and will send new order for this.  Done.  I attempted to call pt.

## 2017-03-13 NOTE — Telephone Encounter (Signed)
[facility] to [facility] [place],  and pt has prescription for BN depakote ER 500mg  po bid.  (5 day supply is $55.12).  Would it be ok to try generic on him?

## 2017-03-13 NOTE — Telephone Encounter (Signed)
yes

## 2017-03-14 NOTE — Telephone Encounter (Signed)
LMVM for pt home # to return call.

## 2017-03-14 NOTE — Telephone Encounter (Signed)
I have not heard from pt.   [facility]  and wife did pick up medication (generic) for pt.  They did relay not BN but generic. Due to change, could have risk of sz. I will attempt to call pt again, Monday.

## 2017-03-18 ENCOUNTER — Encounter: Payer: Self-pay | Admitting: *Deleted

## 2017-03-18 NOTE — Telephone Encounter (Signed)
Mailed letter to pt

## 2017-06-18 ENCOUNTER — Other Ambulatory Visit: Payer: Self-pay | Admitting: Nurse Practitioner

## 2017-06-18 DIAGNOSIS — R569 Unspecified convulsions: Secondary | ICD-10-CM

## 2017-07-13 NOTE — Progress Notes (Signed)
[facility]  PATIENT: Nicholas Harvey DOB: 04/01/1985   REASON FOR VISIT  follow-up for generalized seizure disorder HISTORY FROM: Patient    HISTORY OF PRESENT ILLNESS:Nicholas Harvey, 32-year-old male returns for yearly followup. 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 He started having seizures around 2003, which he describes as generalized convulsions with associated loss of consciousness and postictal lethargy and confusion. He has been on Depakote for several years for his seizures, and his last known seizure was in November 2015 he was out of medication for several days. Outside MRI and EEG were unremarkable. He returns today saying he is doing well. He denies any problems with his Depakote.He claims he is compliant with medication regime. He needs medication refills and labs. No new neurologic complaints. 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    REVIEW OF SYSTEMS: Full 14 system review of systems performed and notable only for those listed, all others are neg:  Constitutional: neg  Cardiovascular: neg Ear/Nose/Throat: neg  Skin: neg Eyes: neg Respiratory: neg Gastroitestinal: neg  Hematology/Lymphatic: neg  Endocrine: neg Musculoskeletal:neg Allergy/Immunology: neg Neurological: Seizure disorder Psychiatric: Anxiety Sleep : neg   ALLERGIES: No Known Allergies  HOME MEDICATIONS: Outpatient Medications Prior to Visit  Medication Sig Dispense Refill  . divalproex (DEPAKOTE ER) 500 MG 24 hr tablet Take 1 tablet (500 mg total) by mouth 2 (two) times daily. 180 tablet 3  . Multiple Vitamins-Minerals (MULTIVITAMIN & MINERAL PO) Take by mouth.    . Omega-3 Fatty Acids (FISH OIL EXTRA STRENGTH PO) Take by mouth.     No facility-administered medications prior to visit.     PAST MEDICAL HISTORY: Past Medical History:  Diagnosis Date  . Hypertension   . Seizures (HCC)     PAST SURGICAL  HISTORY: Past Surgical History:  Procedure Laterality Date  . NONE      FAMILY HISTORY: Family History  Problem Relation Age of Onset  . Seizures Mother   . Asthma Mother   . Hypertension Mother   . Emphysema Mother   . Hypertension Father   . Asthma Sister   . Hypertension Maternal Grandmother   . Kidney disease Maternal Grandmother   . Hypertension Paternal Grandmother   . Kidney disease Paternal Grandfather     SOCIAL HISTORY: Social History   Socioeconomic History  . Marital status: Single    Spouse name: Victorino DikeJennifer   . Number of children: 0  . Years of education: Masters  . Highest education level: Not on file  Occupational History  . Occupation: Occupational hygienistTeacher/Football coach     Employer: [employer]    Employer: [employer]  Social Needs  . Financial resource strain: Not on file  . Food insecurity:    Worry: Not on file    Inability: Not on file  . Transportation needs:    Medical: Not on file    Non-medical: Not on file  Tobacco Use  . Smoking status: Never Smoker  . Smokeless tobacco: Never Used  Substance and Sexual Activity  . Alcohol use: Yes    Comment: 2-3 cans beer a week  . Drug use: No  . Sexual activity: Yes    Partners: Female    Birth control/protection: Condom  Lifestyle  . Physical activity:    Days per week: Not on file    Minutes per session: Not on file  . Stress: Not on file  Relationships  . Social connections:  Talks on phone: Not on file    Gets together: Not on file    Attends religious service: Not on file    Active member of club or organization: Not on file    Attends meetings of clubs or organizations: Not on file    Relationship status: Not on file  . Intimate partner violence:    Fear of current or ex partner: Not on file    Emotionally abused: Not on file    Physically abused: Not on file    Forced sexual activity: Not on file  Other Topics Concern  . Not on file  Social History Narrative    Patient lives at home with Verl Dicker girlfriend.    Patient has no children.    Patient has his Masters in education.    Patient is right handed.    Patient is currently working for [employer].            PHYSICAL EXAM  Vitals:   07/14/17 1340  BP: (!) 151/99  Pulse: 90  Weight: 268 lb (121.6 kg)  Height: 6' (1.829 m)   Body mass index is 36.35 kg/m. General: well developed, obese male well nourished, seated, in no evident distress  Head: head normocephalic and atraumatic. Oropharynx benign  Neck: supple   Neurologic Exam  Mental Status: Awake and fully alert. Oriented to place and time. Follows all commands. Speech and language normal.  Cranial Nerves: Pupils equal, briskly reactive to light. Extraocular movements full without nystagmus. Visual fields full to confrontation. Hearing intact and symmetric to finger snap. Facial sensation intact. Face, tongue, palate move normally and symmetrically. Neck flexion and extension normal.  Motor: Normal bulk and tone. Normal strength in all tested extremity muscles.No focal weakness  Coordination: Rapid alternating movements normal in all extremities. Finger-to-nose and heel-to-shin performed accurately bilaterally.  Gait and Station: Arises from chair without difficulty. Stance is normal. Gait demonstrates normal stride length and balance . Able to heel, toe and tandem walk without difficulty.  Reflexes: 2+ and symmetric. Toes downgoing.    DIAGNOSTIC DATA (LABS, IMAGING, TESTING) -   ASSESSMENT AND PLAN 32 y.o. year old male has a past medical history of Seizures and Hypertension, here to follow-up for his seizure disorder. Last seizure occurred in November 2015 after missing several doses of medication.    Continue Depakote at current dose will refill for one year after labs return Check labs today, CBC, CMP for adverse effects of Depakote  VPA level to therapeutic level/ toxicity Follow-up  yearly and when necessary Call for any seizure activity Nicholas Harvey, [facility], [facility], APRN  [facility] [address], [address] [place], [place] [address] [phone]

## 2017-07-14 ENCOUNTER — Ambulatory Visit: Payer: BC Managed Care – PPO | Admitting: Nurse Practitioner

## 2017-07-14 ENCOUNTER — Encounter: Payer: Self-pay | Admitting: Nurse Practitioner

## 2017-07-14 VITALS — BP 151/99 | HR 90 | Ht 72.0 in | Wt 268.0 lb

## 2017-07-14 DIAGNOSIS — G40309 Generalized idiopathic epilepsy and epileptic syndromes, not intractable, without status epilepticus: Secondary | ICD-10-CM

## 2017-07-14 DIAGNOSIS — Z5181 Encounter for therapeutic drug level monitoring: Secondary | ICD-10-CM

## 2017-07-14 NOTE — Progress Notes (Signed)
I have reviewed and agreed above plan. 

## 2017-07-14 NOTE — Patient Instructions (Signed)
Continue Depakote at current dose will refill for one year Check labs today, CBC, CMP for adverse effects of Depakote  VPA level to therapeutic level/ toxicity Follow-up yearly and when necessary Call for any seizure activity

## 2017-07-15 ENCOUNTER — Telehealth: Payer: Self-pay | Admitting: *Deleted

## 2017-07-15 ENCOUNTER — Other Ambulatory Visit: Payer: Self-pay | Admitting: Nurse Practitioner

## 2017-07-15 DIAGNOSIS — R569 Unspecified convulsions: Secondary | ICD-10-CM

## 2017-07-15 LAB — COMPREHENSIVE METABOLIC PANEL
ALBUMIN: 4.7 g/dL (ref 3.5–5.5)
ALK PHOS: 59 IU/L (ref 39–117)
ALT: 27 IU/L (ref 0–44)
AST: 24 IU/L (ref 0–40)
Albumin/Globulin Ratio: 1.6 (ref 1.2–2.2)
BUN / CREAT RATIO: 23 — AB (ref 9–20)
BUN: 22 mg/dL — AB (ref 6–20)
Bilirubin Total: 0.5 mg/dL (ref 0.0–1.2)
CALCIUM: 10.2 mg/dL (ref 8.7–10.2)
CO2: 23 mmol/L (ref 20–29)
CREATININE: 0.95 mg/dL (ref 0.76–1.27)
Chloride: 101 mmol/L (ref 96–106)
GFR calc Af Amer: 121 mL/min/{1.73_m2} (ref >59)
GFR calc non Af Amer: 105 mL/min/{1.73_m2} (ref >59)
GLUCOSE: 84 mg/dL (ref 65–99)
Globulin, Total: 3 g/dL (ref 1.5–4.5)
Potassium: 4.8 mmol/L (ref 3.5–5.2)
Sodium: 139 mmol/L (ref 134–144)
Total Protein: 7.7 g/dL (ref 6.0–8.5)

## 2017-07-15 LAB — CBC WITH DIFFERENTIAL/PLATELET
Basophils Absolute: 0 10*3/uL (ref 0.0–0.2)
Basos: 0 %
EOS (ABSOLUTE): 0.1 10*3/uL (ref 0.0–0.4)
EOS: 1 %
HEMATOCRIT: 46 % (ref 37.5–51.0)
HEMOGLOBIN: 14.7 g/dL (ref 13.0–17.7)
Immature Grans (Abs): 0 10*3/uL (ref 0.0–0.1)
Immature Granulocytes: 0 %
Lymphocytes Absolute: 2.2 10*3/uL (ref 0.7–3.1)
Lymphs: 41 %
MCH: 28.8 pg (ref 26.6–33.0)
MCHC: 32 g/dL (ref 31.5–35.7)
MCV: 90 fL (ref 79–97)
MONOCYTES: 10 %
Monocytes Absolute: 0.6 10*3/uL (ref 0.1–0.9)
NEUTROS PCT: 48 %
Neutrophils Absolute: 2.6 10*3/uL (ref 1.4–7.0)
Platelets: 177 10*3/uL (ref 150–450)
RBC: 5.1 x10E6/uL (ref 4.14–5.80)
RDW: 14.2 % (ref 12.3–15.4)
WBC: 5.5 10*3/uL (ref 3.4–10.8)

## 2017-07-15 LAB — VALPROIC ACID LEVEL: Valproic Acid Lvl: 48 ug/mL — ABNORMAL LOW (ref 50–100)

## 2017-07-15 MED ORDER — DIVALPROEX SODIUM ER 500 MG PO TB24: 500.0000 mg | ORAL_TABLET | Freq: Two times a day (BID) | ORAL | 3 refills | Status: DC

## 2017-07-15 NOTE — Telephone Encounter (Signed)
LVM informing patient his labs are stable. Advised he continue taking his medications as prescribed. Left number for any questions.

## 2018-03-16 ENCOUNTER — Encounter: Payer: Self-pay | Admitting: Nurse Practitioner

## 2018-07-20 ENCOUNTER — Ambulatory Visit: Payer: BC Managed Care – PPO | Admitting: Nurse Practitioner

## 2018-09-29 ENCOUNTER — Telehealth: Payer: Self-pay | Admitting: *Deleted

## 2018-09-29 DIAGNOSIS — R569 Unspecified convulsions: Secondary | ICD-10-CM

## 2018-09-29 MED ORDER — DIVALPROEX SODIUM ER 500 MG PO TB24: 500.0000 mg | ORAL_TABLET | Freq: Two times a day (BID) | ORAL | 3 refills | Status: DC

## 2018-09-29 NOTE — Addendum Note (Signed)
Addended by: Brandon Melnick on: 09/29/2018 02:43 PM   Modules accepted: Orders

## 2018-09-29 NOTE — Telephone Encounter (Signed)
Pt has called back, he scheduled his med check and is asking his refill of divalproex (DEPAKOTE ER) 500 MG 24 hr tablet be sent to  [facility] in [place], [place]

## 2018-09-29 NOTE — Progress Notes (Signed)
PATIENT: Nicholas Harvey DOB: 02/24/1985  REASON FOR VISIT: follow up HISTORY FROM: patient  HISTORY OF PRESENT ILLNESS: Today 09/30/18  [name] is a 33 year old male with history of epilepsy.  He started having seizures in 2003, that he describes as general convulsions associated with loss of consciousness and postictal lethargy and confusion.  His last seizure occurred in November 2015 as result of running out of medications.  Outside MRI and EEG has been unremarkable.  He remains on Depakote and reports compliance with medication.  He denies any recurrent seizure.  He reports he is tolerating the medication well.  He reports he is now taking blood pressure medication, but he cannot remember the name.  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  He is taking a break from coaching football.  He drives a car without difficulty.  He presents today for follow-up unaccompanied.  HISTORY 07/14/2017 CM: Nicholas Harvey, [age]year-old male returns for yearly followup. 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 He started having seizures around 2003, which he describes as generalized convulsions with associated loss of consciousness and postictal lethargy and confusion. He has been on Depakote for several years for his seizures, and his last known seizure was in November 2015 he was out of medication for several days. Outside MRI and EEG were unremarkable. He returns today saying he is doing well. He denies any problems with his Depakote.He claims he is compliant with medication regime. He needs medication refills and labs. No new neurologic complaints. 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    REVIEW OF SYSTEMS: Out of a complete 14 system review of symptoms, the patient complains only of the following symptoms, and all other reviewed systems are negative.  Seizures  ALLERGIES: No Known Allergies  HOME MEDICATIONS: Outpatient Medications  Prior to Visit  Medication Sig Dispense Refill  . divalproex (DEPAKOTE ER) 500 MG 24 hr tablet Take 1 tablet (500 mg total) by mouth 2 (two) times daily. 180 tablet 3  . Multiple Vitamins-Minerals (MULTIVITAMIN & MINERAL PO) Take by mouth.    . Omega-3 Fatty Acids (FISH OIL EXTRA STRENGTH PO) Take by mouth.     No facility-administered medications prior to visit.     PAST MEDICAL HISTORY: Past Medical History:  Diagnosis Date  . Hypertension   . Seizures (HCC)     PAST SURGICAL HISTORY: Past Surgical History:  Procedure Laterality Date  . NONE      FAMILY HISTORY: Family History  Problem Relation Age of Onset  . Seizures Mother   . Asthma Mother   . Hypertension Mother   . Emphysema Mother   . Hypertension Father   . Asthma Sister   . Hypertension Maternal Grandmother   . Kidney disease Maternal Grandmother   . Hypertension Paternal Grandmother   . Kidney disease Paternal Grandfather     SOCIAL HISTORY: Social History   Socioeconomic History  . Marital status: Single    Spouse name: Victorino DikeJennifer   . Number of children: 0  . Years of education: Masters  . Highest education level: Not on file  Occupational History  . Occupation: Occupational hygienistTeacher/Football coach     Employer: [employer]    Employer: [employer]  Social Needs  . Financial resource strain: Not on file  . Food insecurity    Worry: Not on file    Inability: Not on file  . Transportation needs  Medical: Not on file    Non-medical: Not on file  Tobacco Use  . Smoking status: Never Smoker  . Smokeless tobacco: Never Used  Substance and Sexual Activity  . Alcohol use: Yes    Comment: 2-3 cans beer a week  . Drug use: No  . Sexual activity: Yes    Partners: Female    Birth control/protection: Condom  Lifestyle  . Physical activity    Days per week: Not on file    Minutes per session: Not on file  . Stress: Not on file  Relationships  . Social Musician on phone: Not  on file    Gets together: Not on file    Attends religious service: Not on file    Active member of club or organization: Not on file    Attends meetings of clubs or organizations: Not on file    Relationship status: Not on file  . Intimate partner violence    Fear of current or ex partner: Not on file    Emotionally abused: Not on file    Physically abused: Not on file    Forced sexual activity: Not on file  Other Topics Concern  . Not on file  Social History Narrative   Patient lives at home with Verl Dicker girlfriend.    Patient has no children.    Patient has his Masters in education.    Patient is right handed.    Patient is currently working for [employer].           PHYSICAL EXAM  Vitals:   09/30/18 1439  BP: 134/80  Pulse: 75  Temp: 97.7 F (36.5 C)  TempSrc: Oral  Weight: 259 lb (117.5 kg)  Height: 6' (1.829 m)   Body mass index is 35.13 kg/m.  Generalized: Well developed, in no acute distress   Neurological examination  Mentation: Alert oriented to time, place, history taking. Follows all commands speech and language fluent Cranial nerve II-XII: Pupils were equal round reactive to light. Extraocular movements were full, visual field were full on confrontational test. Facial sensation and strength were normal.  Head turning and shoulder shrug  were normal and symmetric. Motor: The motor testing reveals 5 over 5 strength of all 4 extremities. Good symmetric motor tone is noted throughout.  Sensory: Sensory testing is intact to soft touch on all 4 extremities. No evidence of extinction is noted.  Coordination: Cerebellar testing reveals good finger-nose-finger and heel-to-shin bilaterally.  Gait and station: Gait is normal. Tandem gait is normal.   Reflexes: Deep tendon reflexes are symmetric and normal bilaterally.   DIAGNOSTIC DATA (LABS, IMAGING, TESTING) - I reviewed patient records, labs, notes, testing and imaging myself where  available.  Lab Results  Component Value Date   WBC 5.5 07/14/2017   HGB 14.7 07/14/2017   HCT 46.0 07/14/2017   MCV 90 07/14/2017   PLT 177 07/14/2017      Component Value Date/Time   NA 139 07/14/2017 1359   K 4.8 07/14/2017 1359   CL 101 07/14/2017 1359   CO2 23 07/14/2017 1359   GLUCOSE 84 07/14/2017 1359   BUN 22 (H) 07/14/2017 1359   CREATININE 0.95 07/14/2017 1359   CALCIUM 10.2 07/14/2017 1359   PROT 7.7 07/14/2017 1359   ALBUMIN 4.7 07/14/2017 1359   AST 24 07/14/2017 1359   ALT 27 07/14/2017 1359   ALKPHOS 59 07/14/2017 1359   BILITOT 0.5 07/14/2017 1359   GFRNONAA 105 07/14/2017  [place] 07/14/2017 1359   No results found for: CHOL, HDL, LDLCALC, LDLDIRECT, TRIG, CHOLHDL No results found for: HGBA1C No results found for: VITAMINB12 No results found for: TSH   ASSESSMENT AND PLAN 33 y.o. year old male  has a past medical history of Hypertension and Seizures ([place]). here with:  1.  Seizures -He continues to do well, no seizures since 2015 -Continue Depakote ER 500 mg tablet, 1 tablet twice a day -I will check routine lab work today, including a Depakote level -He will follow-up in 1 year or sooner if needed  I spent 15 minutes with the patient. 50% of this time was spent discussing his plan of care.   Butler Denmark, AGNP-C, DNP 09/30/2018, 3:17 PM [facility] [address], [place], [place] [address] [phone]

## 2018-09-29 NOTE — Telephone Encounter (Signed)
Received fax for refill on divalproex. Last seen 07/2017 with CM/NP.  I called and LMVM for pt to return call and set up f/u appt so can refill your sz meds.

## 2018-09-30 ENCOUNTER — Ambulatory Visit: Payer: BC Managed Care – PPO | Admitting: Neurology

## 2018-09-30 ENCOUNTER — Other Ambulatory Visit: Payer: Self-pay

## 2018-09-30 ENCOUNTER — Encounter: Payer: Self-pay | Admitting: Neurology

## 2018-09-30 VITALS — BP 134/80 | HR 75 | Temp 97.7°F | Ht 72.0 in | Wt 259.0 lb

## 2018-09-30 DIAGNOSIS — G40309 Generalized idiopathic epilepsy and epileptic syndromes, not intractable, without status epilepticus: Secondary | ICD-10-CM

## 2018-09-30 NOTE — Patient Instructions (Signed)
1. Continue at current dose 2. Lab work today  3. Return in 1 year

## 2018-10-01 ENCOUNTER — Telehealth: Payer: Self-pay

## 2018-10-01 LAB — COMPREHENSIVE METABOLIC PANEL
ALT: 24 IU/L (ref 0–44)
AST: 22 IU/L (ref 0–40)
Albumin/Globulin Ratio: 1.9 (ref 1.2–2.2)
Albumin: 5 g/dL (ref 4.0–5.0)
Alkaline Phosphatase: 60 IU/L (ref 39–117)
BUN/Creatinine Ratio: 15 (ref 9–20)
BUN: 15 mg/dL (ref 6–20)
Bilirubin Total: 0.6 mg/dL (ref 0.0–1.2)
CO2: 24 mmol/L (ref 20–29)
Calcium: 9.8 mg/dL (ref 8.7–10.2)
Chloride: 100 mmol/L (ref 96–106)
Creatinine, Ser: 0.97 mg/dL (ref 0.76–1.27)
GFR calc Af Amer: 116 mL/min/{1.73_m2} (ref >59)
GFR calc non Af Amer: 101 mL/min/{1.73_m2} (ref >59)
Globulin, Total: 2.7 g/dL (ref 1.5–4.5)
Glucose: 82 mg/dL (ref 65–99)
Potassium: 4.3 mmol/L (ref 3.5–5.2)
Sodium: 139 mmol/L (ref 134–144)
Total Protein: 7.7 g/dL (ref 6.0–8.5)

## 2018-10-01 LAB — CBC WITH DIFFERENTIAL/PLATELET
Basophils Absolute: 0 10*3/uL (ref 0.0–0.2)
Basos: 1 %
EOS (ABSOLUTE): 0.1 10*3/uL (ref 0.0–0.4)
Eos: 2 %
Hematocrit: 42.4 % (ref 37.5–51.0)
Hemoglobin: 14.2 g/dL (ref 13.0–17.7)
Immature Grans (Abs): 0 10*3/uL (ref 0.0–0.1)
Immature Granulocytes: 0 %
Lymphocytes Absolute: 2.3 10*3/uL (ref 0.7–3.1)
Lymphs: 43 %
MCH: 28.6 pg (ref 26.6–33.0)
MCHC: 33.5 g/dL (ref 31.5–35.7)
MCV: 86 fL (ref 79–97)
Monocytes Absolute: 0.6 10*3/uL (ref 0.1–0.9)
Monocytes: 11 %
Neutrophils Absolute: 2.3 10*3/uL (ref 1.4–7.0)
Neutrophils: 43 %
Platelets: 164 10*3/uL (ref 150–450)
RBC: 4.96 x10E6/uL (ref 4.14–5.80)
RDW: 13.8 % (ref 11.6–15.4)
WBC: 5.4 10*3/uL (ref 3.4–10.8)

## 2018-10-01 LAB — VALPROIC ACID LEVEL: Valproic Acid Lvl: 68 ug/mL (ref 50–100)

## 2018-10-01 NOTE — Progress Notes (Signed)
I have reviewed and agreed above plan. 

## 2018-10-01 NOTE — Telephone Encounter (Signed)
Called pt to review his labs with him. NALVM.  "Notes recorded by Suzzanne Cloud, NP on 10/01/2018 Labs look good. No changes. Good level of Depakote"  -Waynette Buttery. 10/01/18

## 2019-10-04 ENCOUNTER — Encounter: Payer: Self-pay | Admitting: Neurology

## 2019-10-05 ENCOUNTER — Ambulatory Visit: Payer: BC Managed Care – PPO | Admitting: Neurology

## 2019-10-27 ENCOUNTER — Other Ambulatory Visit: Payer: Self-pay | Admitting: Neurology

## 2019-10-27 DIAGNOSIS — R569 Unspecified convulsions: Secondary | ICD-10-CM

## 2019-12-06 ENCOUNTER — Encounter: Payer: Self-pay | Admitting: Neurology

## 2019-12-06 ENCOUNTER — Telehealth (INDEPENDENT_AMBULATORY_CARE_PROVIDER_SITE_OTHER): Payer: BC Managed Care – PPO | Admitting: Neurology

## 2019-12-06 DIAGNOSIS — G40309 Generalized idiopathic epilepsy and epileptic syndromes, not intractable, without status epilepticus: Secondary | ICD-10-CM

## 2019-12-06 DIAGNOSIS — R569 Unspecified convulsions: Secondary | ICD-10-CM | POA: Diagnosis not present

## 2019-12-06 MED ORDER — DIVALPROEX SODIUM ER 500 MG PO TB24: 500.0000 mg | ORAL_TABLET | Freq: Two times a day (BID) | ORAL | 4 refills | Status: DC

## 2019-12-06 NOTE — Progress Notes (Signed)
    Virtual Visit via Video Note  I connected with Nicholas Harvey on 12/06/19 at  3:45 PM EST by a video enabled telemedicine application and verified that I am speaking with the correct person using two identifiers.  Location: Patient: at his home Provider: in the office   I discussed the limitations of evaluation and management by telemedicine and the availability of in person appointments. The patient expressed understanding and agreed to proceed.  History of Present Illness: 12/06/2019 SS: Nicholas Harvey is a 34 year old male with history of epilepsy. Started having seizures in 2003, describes his general convulsions associated with LOC, and postictal lethargy and confusion. Last seizure was in November 2015, when he ran out of medications. MRI and EEG has been unremarkable. He remains on Depakote, tolerating well, no recurrent seizure.  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 Does report some anxiety, his mind is always working.  Needs to see PCP soon.  Presents today for evaluation via virtual visit.  No reported adverse effect from Depakote.  09/30/2018 SS: Nicholas Harvey is a 34 year old male with history of epilepsy.  He started having seizures in 2003, that he describes as general convulsions associated with loss of consciousness and postictal lethargy and confusion.  His last seizure occurred in November 2015 as result of running out of medications.  Outside MRI and EEG has been unremarkable.  He remains on Depakote and reports compliance with medication.  He denies any recurrent seizure.  He reports he is tolerating the medication well.  He reports he is now taking blood pressure medication, but he cannot remember the name.  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  He drives a car without difficulty.  He presents today for follow-up unaccompanied.   Observations/Objective: He is alert and  oriented Speech is clear and concise, facial symmetry noted Follows commands Mood is appropriate, judgment is clear No tremor noted in hands  Assessment and Plan: 1.  Seizures -Continues to do well, no recurrent seizure since 2015 -Continue Depakote ER 500 mg tablet, 1 tablet twice a day -CBC, CMP, Depakote level unremarkable at last visit -Call for seizure activity, follow-up 1 year or sooner if needed  Follow Up Instructions: 12/12/2020 3:45   I discussed the assessment and treatment plan with the patient. The patient was provided an opportunity to ask questions and all were answered. The patient agreed with the plan and demonstrated an understanding of the instructions.   The patient was advised to call back or seek an in-person evaluation if the symptoms worsen or if the condition fails to improve as anticipated.  I spent 20 minutes of face-to-face and non-face-to-face time with patient.  This included previsit chart review, lab review, study review, order entry, electronic health record documentation, patient education.  Otila Kluver, DNP  [facility] [address] [place], [place] [address] [phone]

## 2020-02-02 ENCOUNTER — Encounter: Payer: Self-pay | Admitting: Neurology

## 2020-02-17 NOTE — Progress Notes (Signed)
I have reviewed and agreed above plan. 

## 2020-12-04 ENCOUNTER — Telehealth: Payer: Self-pay | Admitting: Neurology

## 2020-12-04 NOTE — Telephone Encounter (Signed)
Sent message asking patient to call back to reschedule appointment due to Maralyn Sago being out.

## 2020-12-12 ENCOUNTER — Ambulatory Visit: Payer: BC Managed Care – PPO | Admitting: Neurology

## 2021-02-05 NOTE — Progress Notes (Signed)
PATIENT: Nicholas Harvey DOB: Mar 05, 1985  REASON FOR VISIT: Follow up for seizures HISTORY FROM: Patient PRIMARY NEUROLOGIST: Dr. Terrace Arabia  HISTORY  12/06/2019 SS: [name] is a 36 year old male with history of epilepsy. Started having seizures in 2003, describes his general convulsions associated with LOC, and postictal lethargy and confusion. Last seizure was in November 2015, when he ran out of medications. MRI and EEG has been unremarkable. He remains on Depakote, tolerating well, no recurrent seizure.  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 Does report some anxiety, his mind is always working.  Needs to see PCP soon.  Presents today for evaluation via virtual visit.  No reported adverse effect from Depakote.  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 No seizures, still on Depakote. No side effects, is compliant. No new medical issues. Working on [employer]. Having trouble sleeping at night chronically. Has 28 year old daughter.   REVIEW OF SYSTEMS: Out of a complete 14 system review of symptoms, the patient complains only of the following symptoms, and all other reviewed systems are negative.  N/a  ALLERGIES: No Known Allergies  HOME MEDICATIONS: Outpatient Medications Prior to Visit  Medication Sig Dispense Refill   amLODipine (NORVASC) 10 MG tablet Take 10 mg by mouth daily.     Multiple Vitamins-Minerals (MULTIVITAMIN & MINERAL PO) Take by mouth.     Omega-3 Fatty Acids (FISH OIL EXTRA STRENGTH PO) Take by mouth.     divalproex (DEPAKOTE ER) 500 MG 24 hr tablet Take 1 tablet (500 mg total) by mouth 2 (two) times daily. 180 tablet 4   No facility-administered medications prior to visit.    PAST MEDICAL HISTORY: Past Medical History:  Diagnosis Date   Hypertension    Seizures (HCC)     PAST SURGICAL HISTORY: Past Surgical History:  Procedure  Laterality Date   NONE      FAMILY HISTORY: Family History  Problem Relation Age of Onset   Seizures Mother    Asthma Mother    Hypertension Mother    Emphysema Mother    Hypertension Father    Asthma Sister    Hypertension Maternal Grandmother    Kidney disease Maternal Grandmother    Hypertension Paternal Grandmother    Kidney disease Paternal Grandfather     SOCIAL HISTORY: Social History   Socioeconomic History   Marital status: Single    Spouse name: Victorino Dike    Number of children: 0   Years of education: Masters   Highest education level: Not on file  Occupational History   Occupation: Occupational hygienist: Advice worker [employer]    Employer: [employer]  Tobacco Use   Smoking status: Never   Smokeless tobacco: Never  Substance and Sexual Activity   Alcohol use: Yes    Comment: 2-3 cans beer a week   Drug use: No   Sexual activity: Yes    Partners: Female    Birth control/protection: Condom  Other Topics Concern   Not on file  Social History Narrative   Patient lives at home with Nicholas Harvey girlfriend.    Patient has no children.    Patient has his Masters in education.    Patient is right handed.    Patient is currently working for [employer].          Social Determinants of Health  Financial Resource Strain: Not on file  Food Insecurity: Not on file  Transportation Needs: Not on file  Physical Activity: Not on file  Stress: Not on file  Social Connections: Not on file  Intimate Partner Violence: Not on file   PHYSICAL EXAM  Vitals:   02/06/21 1335  BP: (!) 155/97  Pulse: 81  Weight: 256 lb (116.1 kg)  Height: 6' (1.829 m)   Body mass index is 34.72 kg/m.  Generalized: Well developed, in no acute distress   Neurological examination  Mentation: Alert oriented to time, place, history taking. Follows all commands speech and language fluent Cranial nerve II-XII: Pupils were equal round  reactive to light. Extraocular movements were full, visual field were full on confrontational test. Facial sensation and strength were normal. Head turning and shoulder shrug  were normal and symmetric. Motor: The motor testing reveals 5 over 5 strength of all 4 extremities. Good symmetric motor tone is noted throughout.  Sensory: Sensory testing is intact to soft touch on all 4 extremities. No evidence of extinction is noted.  Coordination: Cerebellar testing reveals good finger-nose-finger and heel-to-shin bilaterally.  Gait and station: Gait is normal. Reflexes: Deep tendon reflexes are symmetric and normal bilaterally.   DIAGNOSTIC DATA (LABS, IMAGING, TESTING) - I reviewed patient records, labs, notes, testing and imaging myself where available.  Lab Results  Component Value Date   WBC 5.4 09/30/2018   HGB 14.2 09/30/2018   HCT 42.4 09/30/2018   MCV 86 09/30/2018   PLT 164 09/30/2018      Component Value Date/Time   NA 139 09/30/2018 1519   K 4.3 09/30/2018 1519   CL 100 09/30/2018 1519   CO2 24 09/30/2018 1519   GLUCOSE 82 09/30/2018 1519   BUN 15 09/30/2018 1519   CREATININE 0.97 09/30/2018 1519   CALCIUM 9.8 09/30/2018 1519   PROT 7.7 09/30/2018 1519   ALBUMIN 5.0 09/30/2018 1519   AST 22 09/30/2018 1519   ALT 24 09/30/2018 1519   ALKPHOS 60 09/30/2018 1519   BILITOT 0.6 09/30/2018 1519   GFRNONAA 101 09/30/2018 1519   GFRAA 116 09/30/2018 1519   No results found for: CHOL, HDL, LDLCALC, LDLDIRECT, TRIG, CHOLHDL No results found for: SVXB9T No results found for: VITAMINB12 No results found for: TSH  ASSESSMENT AND PLAN 36 y.o. year old male  has a past medical history of Hypertension and Seizures (HCC). here with:  1.  Seizures -Doing well, last seizure was in 2015 -Continue Depakote ER 500 mg twice daily  -Check routine labs today -Can try OTC melatonin starting with 1 mg at bedtime to help sleep -Call for seizure activity, follow-up 1 year or sooner if  needed  Margie Ege, AGNP-C, DNP 02/06/2021, 1:50 PM [facility] [address] [place], [place] [address] [phone]

## 2021-02-06 ENCOUNTER — Encounter: Payer: Self-pay | Admitting: Neurology

## 2021-02-06 ENCOUNTER — Ambulatory Visit: Payer: BC Managed Care – PPO | Admitting: Neurology

## 2021-02-06 VITALS — BP 155/97 | HR 81 | Ht 72.0 in | Wt 256.0 lb

## 2021-02-06 DIAGNOSIS — R569 Unspecified convulsions: Secondary | ICD-10-CM

## 2021-02-06 DIAGNOSIS — G40309 Generalized idiopathic epilepsy and epileptic syndromes, not intractable, without status epilepticus: Secondary | ICD-10-CM | POA: Diagnosis not present

## 2021-02-06 MED ORDER — DIVALPROEX SODIUM ER 500 MG PO TB24: 500.0000 mg | ORAL_TABLET | Freq: Two times a day (BID) | ORAL | 4 refills | Status: DC

## 2021-02-06 NOTE — Patient Instructions (Signed)
Great to see you today  °Check labs  °Continue Depakote  °Call for any seizures  °See you back in 1 year  °

## 2021-02-07 ENCOUNTER — Telehealth: Payer: Self-pay

## 2021-02-07 LAB — COMPREHENSIVE METABOLIC PANEL
ALT: 25 IU/L (ref 0–44)
AST: 19 IU/L (ref 0–40)
Albumin/Globulin Ratio: 2.2 (ref 1.2–2.2)
Albumin: 5.2 g/dL — ABNORMAL HIGH (ref 4.0–5.0)
Alkaline Phosphatase: 61 IU/L (ref 44–121)
BUN/Creatinine Ratio: 13 (ref 9–20)
BUN: 11 mg/dL (ref 6–20)
Bilirubin Total: 0.6 mg/dL (ref 0.0–1.2)
CO2: 24 mmol/L (ref 20–29)
Calcium: 9.7 mg/dL (ref 8.7–10.2)
Chloride: 102 mmol/L (ref 96–106)
Creatinine, Ser: 0.87 mg/dL (ref 0.76–1.27)
Globulin, Total: 2.4 g/dL (ref 1.5–4.5)
Glucose: 86 mg/dL (ref 70–99)
Potassium: 4.4 mmol/L (ref 3.5–5.2)
Sodium: 139 mmol/L (ref 134–144)
Total Protein: 7.6 g/dL (ref 6.0–8.5)
eGFR: 114 mL/min/{1.73_m2} (ref >59)

## 2021-02-07 LAB — CBC WITH DIFFERENTIAL/PLATELET
Basophils Absolute: 0 10*3/uL (ref 0.0–0.2)
Basos: 1 %
EOS (ABSOLUTE): 0.1 10*3/uL (ref 0.0–0.4)
Eos: 1 %
Hematocrit: 41.9 % (ref 37.5–51.0)
Hemoglobin: 14 g/dL (ref 13.0–17.7)
Immature Grans (Abs): 0 10*3/uL (ref 0.0–0.1)
Immature Granulocytes: 1 %
Lymphocytes Absolute: 1.9 10*3/uL (ref 0.7–3.1)
Lymphs: 42 %
MCH: 28.9 pg (ref 26.6–33.0)
MCHC: 33.4 g/dL (ref 31.5–35.7)
MCV: 87 fL (ref 79–97)
Monocytes Absolute: 0.5 10*3/uL (ref 0.1–0.9)
Monocytes: 12 %
Neutrophils Absolute: 1.8 10*3/uL (ref 1.4–7.0)
Neutrophils: 43 %
Platelets: 165 10*3/uL (ref 150–450)
RBC: 4.84 x10E6/uL (ref 4.14–5.80)
RDW: 13.9 % (ref 11.6–15.4)
WBC: 4.3 10*3/uL (ref 3.4–10.8)

## 2021-02-07 LAB — VALPROIC ACID LEVEL: Valproic Acid Lvl: 60 ug/mL (ref 50–100)

## 2021-02-07 NOTE — Telephone Encounter (Signed)
-----   Message from Glean Salvo, NP sent at 02/07/2021  3:27 PM EST ----- Sent my chart note:

## 2021-02-07 NOTE — Telephone Encounter (Signed)
I called the pt and left a vm advising of mychart message: Nicholas Harvey,  Blood work shows no significant abnormalities. No changes to medications. Let me know if you have any questions! Sarah  Pt was advised to call back if he had any questions/concerns.

## 2022-02-07 ENCOUNTER — Encounter: Payer: Self-pay | Admitting: Neurology

## 2022-02-07 ENCOUNTER — Ambulatory Visit: Payer: BC Managed Care – PPO | Admitting: Neurology

## 2022-02-07 DIAGNOSIS — R569 Unspecified convulsions: Secondary | ICD-10-CM

## 2022-02-07 MED ORDER — DIVALPROEX SODIUM ER 500 MG PO TB24: 500.0000 mg | ORAL_TABLET | Freq: Two times a day (BID) | ORAL | 4 refills | Status: DC

## 2022-02-07 NOTE — Progress Notes (Signed)
PATIENT: Nicholas Harvey DOB: 09-14-1985  REASON FOR VISIT: Follow up for seizures HISTORY FROM: Patient PRIMARY NEUROLOGIST: Dr. Krista Blue  HISTORY  12/06/2019 SS: Nicholas Harvey is a 37 year old male with history of epilepsy. Started having seizures in 2003, describes his general convulsions associated with LOC, and postictal lethargy and confusion. Last seizure was in November 2015, when he ran out of medications. MRI and EEG has been unremarkable. He remains on Depakote, tolerating well, no recurrent seizure.  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 Does report some anxiety, his mind is always working.  Needs to see PCP soon.  Presents today for evaluation via virtual visit.  No reported adverse effect from Depakote.  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 No seizures, still on Depakote. No side effects, is compliant. No new medical issues. Working on [employer]. Having trouble sleeping at night chronically. Has 87 year old daughter.   Update February 07, 2022 SS: Doing well, no recent seizures, remains on Depakote ER 500 mg twice daily.  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  No new issues.  REVIEW OF SYSTEMS: Out of a complete 14 system review of symptoms, the patient complains only of the following symptoms, and all other reviewed systems are negative.  See HPI  ALLERGIES: No Known Allergies  HOME MEDICATIONS: Outpatient Medications Prior to Visit  Medication Sig Dispense Refill   amLODipine (NORVASC) 10 MG tablet Take 10 mg by mouth daily.     Multiple Vitamins-Minerals (MULTIVITAMIN & MINERAL PO) Take by mouth.     Omega-3 Fatty Acids (FISH OIL EXTRA STRENGTH PO) Take by mouth.     divalproex (DEPAKOTE ER) 500 MG 24 hr tablet Take 1 tablet (500 mg total) by mouth 2 (two) times daily. 180 tablet 4   No facility-administered medications prior to visit.     PAST MEDICAL HISTORY: Past Medical History:  Diagnosis Date   Hypertension    Seizures ([place])     PAST SURGICAL HISTORY: Past Surgical History:  Procedure Laterality Date   NONE      FAMILY HISTORY: Family History  Problem Relation Age of Onset   Seizures Mother    Asthma Mother    Hypertension Mother    Emphysema Mother    Hypertension Father    Asthma Sister    Hypertension Maternal Grandmother    Kidney disease Maternal Grandmother    Hypertension Paternal Grandmother    Kidney disease Paternal Grandfather     SOCIAL HISTORY: Social History   Socioeconomic History   Marital status: Single    Spouse name: Anderson Malta    Number of children: 0   Years of education: Masters   Highest education level: Not on file  Occupational History   Occupation: Youth worker: Psychologist, 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    Patient has no children.    Patient has his  Masters in education.    Patient is right handed.    Patient is currently working for [employer].          Social Determinants of Health   Financial Resource Strain: Not on file  Food Insecurity: Not on file  Transportation Needs: Not on file  Physical Activity: Not on file  Stress: Not on file  Social Connections: Not on file  Intimate Partner Violence: Not on file   PHYSICAL EXAM  There were no vitals filed for this visit.  There is no height or weight on file to calculate BMI.  Generalized: Well developed, in no acute distress   Neurological examination  Mentation: Alert oriented to  time, place, history taking. Follows all commands speech and language fluent Cranial nerve II-XII: Pupils were equal round reactive to light. Extraocular movements were full, visual field were full on confrontational test. Facial sensation and strength were normal. Head turning and shoulder shrug  were normal and symmetric. Motor: The motor testing reveals 5 over 5 strength of all 4 extremities. Good symmetric motor tone is noted throughout.  Sensory: Sensory testing is intact to soft touch on all 4 extremities. No evidence of extinction is noted.  Coordination: Cerebellar testing reveals good finger-nose-finger and heel-to-shin bilaterally.  Gait and station: Gait is normal. Reflexes: Deep tendon reflexes are symmetric and normal bilaterally.   DIAGNOSTIC DATA (LABS, IMAGING, TESTING) - I reviewed patient records, labs, notes, testing and imaging myself where available.  Lab Results  Component Value Date   WBC 4.3 02/06/2021   HGB 14.0 02/06/2021   HCT 41.9 02/06/2021   MCV 87 02/06/2021   PLT 165 02/06/2021      Component Value Date/Time   NA 139 02/06/2021 1354   K 4.4 02/06/2021 1354   CL 102 02/06/2021 1354   CO2 24 02/06/2021 1354   GLUCOSE 86 02/06/2021 1354   BUN 11 02/06/2021 1354   CREATININE 0.87 02/06/2021 1354   CALCIUM 9.7 02/06/2021 1354   PROT 7.6 02/06/2021 1354   ALBUMIN 5.2 (H) 02/06/2021 1354   AST 19 02/06/2021 1354   ALT 25 02/06/2021 1354   ALKPHOS 61 02/06/2021 1354   BILITOT 0.6 02/06/2021 1354   GFRNONAA 101 09/30/2018 1519   GFRAA 116 09/30/2018 1519   No results found for: "CHOL", "HDL", "LDLCALC", "LDLDIRECT", "TRIG", "CHOLHDL" No results found for: "HGBA1C" No results found for: "VITAMINB12" No results found for: "TSH"  ASSESSMENT AND PLAN 37 y.o. year old male  has a past medical history of Hypertension and Seizures ([place]). here with:  1.  Seizures  -Last seizure was in 2015, continue Depakote ER 500 mg BID -Check routine labs  today -Call for seizure activity, follow-up 1 year or sooner if needed, will do video visit   Butler Denmark, AGNP-C, DNP 02/07/2022, 1:16 PM [facility] [address], [place], [place] [address] [phone]

## 2022-02-08 LAB — CBC WITH DIFFERENTIAL/PLATELET
Basophils Absolute: 0 10*3/uL (ref 0.0–0.2)
Basos: 1 %
EOS (ABSOLUTE): 0.1 10*3/uL (ref 0.0–0.4)
Eos: 2 %
Hematocrit: 42.6 % (ref 37.5–51.0)
Hemoglobin: 13.5 g/dL (ref 13.0–17.7)
Immature Grans (Abs): 0 10*3/uL (ref 0.0–0.1)
Immature Granulocytes: 0 %
Lymphocytes Absolute: 1.9 10*3/uL (ref 0.7–3.1)
Lymphs: 46 %
MCH: 27.4 pg (ref 26.6–33.0)
MCHC: 31.7 g/dL (ref 31.5–35.7)
MCV: 87 fL (ref 79–97)
Monocytes Absolute: 0.4 10*3/uL (ref 0.1–0.9)
Monocytes: 10 %
Neutrophils Absolute: 1.6 10*3/uL (ref 1.4–7.0)
Neutrophils: 41 %
Platelets: 187 10*3/uL (ref 150–450)
RBC: 4.92 x10E6/uL (ref 4.14–5.80)
RDW: 13.5 % (ref 11.6–15.4)
WBC: 4 10*3/uL (ref 3.4–10.8)

## 2022-02-08 LAB — COMPREHENSIVE METABOLIC PANEL
ALT: 22 IU/L (ref 0–44)
AST: 17 IU/L (ref 0–40)
Albumin/Globulin Ratio: 1.7 (ref 1.2–2.2)
Albumin: 4.8 g/dL (ref 4.1–5.1)
Alkaline Phosphatase: 62 IU/L (ref 44–121)
BUN/Creatinine Ratio: 11 (ref 9–20)
BUN: 11 mg/dL (ref 6–20)
Bilirubin Total: 0.7 mg/dL (ref 0.0–1.2)
CO2: 23 mmol/L (ref 20–29)
Calcium: 9.7 mg/dL (ref 8.7–10.2)
Chloride: 104 mmol/L (ref 96–106)
Creatinine, Ser: 1.01 mg/dL (ref 0.76–1.27)
Globulin, Total: 2.8 g/dL (ref 1.5–4.5)
Glucose: 86 mg/dL (ref 70–99)
Potassium: 4.5 mmol/L (ref 3.5–5.2)
Sodium: 141 mmol/L (ref 134–144)
Total Protein: 7.6 g/dL (ref 6.0–8.5)
eGFR: 98 mL/min/{1.73_m2} (ref >59)

## 2022-02-08 LAB — VALPROIC ACID LEVEL: Valproic Acid Lvl: 47 ug/mL — ABNORMAL LOW (ref 50–100)

## 2023-01-28 NOTE — Progress Notes (Addendum)
 PATIENT: Nicholas Harvey DOB: April 22, 1985  REASON FOR VISIT: Follow up for seizures HISTORY FROM: Patient PRIMARY NEUROLOGIST: Dr. Gracie Lav  Virtual Visit via Video Note  I connected with Nicholas Harvey on 01/29/2023 at  2:15 PM EST by a video enabled telemedicine application and verified that I am speaking with the correct person using two identifiers.  Location: Patient: at work Provider: in the office    I discussed the limitations of evaluation and management by telemedicine and the availability of in person appointments. The patient expressed understanding and agreed to proceed.  HISTORY  12/06/2019 SS: Mr. Dohn is a 37 year old male with history of epilepsy. Started having seizures in 2003, describes his general convulsions associated with LOC, and postictal lethargy and confusion. Last seizure was in November 2015, when he ran out of medications. MRI and EEG has been unremarkable. He remains on Depakote , tolerating well, no recurrent seizure.  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 Does report some anxiety, his mind is always working.  Needs to see PCP soon.  Presents today for evaluation via virtual visit.  No reported adverse effect from Depakote .  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 No seizures, still on Depakote . No side effects, is compliant. No new medical issues. Working on [employer]. Having trouble sleeping at night chronically. Has 69 year old daughter.   Update February 07, 2022 SS: Doing well, no recent seizures, remains on Depakote  ER 500 mg twice daily.  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  No new issues.  Update January 29, 2023 SS: Labs at last visit January 2024 showed normal CBC, CMP, Depakote  level 47. Is an assistant principal. No seizures, remains on Depakote  ER 500 mg BID. Follow with PCP for HTN, HLD, reports normal labs. Has  made healthy lifestyle changes.   REVIEW OF SYSTEMS: Out of a complete 14 system review of symptoms, the patient complains only of the following symptoms, and all other reviewed systems are negative.  See HPI  ALLERGIES: No Known Allergies  HOME MEDICATIONS: Outpatient Medications Prior to Visit  Medication Sig Dispense Refill   amLODipine (NORVASC) 10 MG tablet Take 10 mg by mouth daily.     divalproex  (DEPAKOTE  ER) 500 MG 24 hr tablet Take 1 tablet (500 mg total) by mouth 2 (two) times daily. 180 tablet 4   Multiple Vitamins-Minerals (MULTIVITAMIN & MINERAL PO) Take by mouth.     Omega-3 Fatty Acids (FISH OIL EXTRA STRENGTH PO) Take by mouth.     No facility-administered medications prior to visit.    PAST MEDICAL HISTORY: Past Medical History:  Diagnosis Date   Hypertension    Seizures ([facility])     PAST SURGICAL HISTORY: Past Surgical History:  Procedure Laterality Date   NONE      FAMILY HISTORY: Family History  Problem Relation Age of Onset   Seizures Mother    Asthma Mother    Hypertension Mother    Emphysema Mother    Hypertension Father    Asthma Sister    Hypertension Maternal Grandmother    Kidney disease Maternal Grandmother    Hypertension Paternal Grandmother    Kidney disease Paternal Grandfather     SOCIAL HISTORY: Social History   Socioeconomic History   Marital status: Single    Spouse name: Bridgette Campus    Number of children: 0   Years of  education: Masters   Highest education level: Not on file  Occupational History   Occupation: Occupational hygienist: Interior and spatial designer: [employer]  Tobacco Use   Smoking status: Never   Smokeless tobacco: Never  Substance and Sexual Activity   Alcohol use: Yes    Comment: 2-3 cans beer a week   Drug use: No   Sexual activity: Yes    Partners: Female    Birth control/protection: Condom  Other Topics Concern   Not on file  Social History Narrative   Patient  lives at home with Craig Dixie girlfriend.    Patient has no children.    Patient has his Masters in education.    Patient is right handed.    Patient is currently working for [employer].          Social Drivers of Corporate investment banker Strain: Not on file  Food Insecurity: Not on file  Transportation Needs: Not on file  Physical Activity: Not on file  Stress: Not on file  Social Connections: Unknown (05/19/2021)   Received from [facility]   Social Network    Social Network: Not on file  Intimate Partner Violence: Unknown (04/16/2021)   Received from [facility]   HITS    Physically Hurt: Not on file    Insult or Talk Down To: Not on file    Threaten Physical Harm: Not on file    Scream or Curse: Not on file   PHYSICAL EXAM  There were no vitals filed for this visit.  There is no height or weight on file to calculate BMI.  Generalized: Well developed, in no acute distress  Via video visit, is alert and oriented, speech is clear and concise, facial symmetry noted, moves about freely    DIAGNOSTIC DATA (LABS, IMAGING, TESTING) - I reviewed patient records, labs, notes, testing and imaging myself where available.  Lab Results  Component Value Date   WBC 4.0 02/07/2022   HGB 13.5 02/07/2022   HCT 42.6 02/07/2022   MCV 87 02/07/2022   PLT 187 02/07/2022      Component Value Date/Time   NA 141 02/07/2022 1316   K 4.5 02/07/2022 1316   CL 104 02/07/2022 1316   CO2 23 02/07/2022 1316   GLUCOSE 86 02/07/2022 1316   BUN 11 02/07/2022 1316   CREATININE 1.01 02/07/2022 1316   CALCIUM 9.7 02/07/2022 1316   PROT 7.6 02/07/2022 1316   ALBUMIN 4.8 02/07/2022 1316   AST 17 02/07/2022 1316   ALT 22 02/07/2022 1316   ALKPHOS 62 02/07/2022 1316   BILITOT 0.7 02/07/2022 1316   GFRNONAA 101 09/30/2018 1519   GFRAA 116 09/30/2018 1519   No results found for: "CHOL", "HDL", "LDLCALC", "LDLDIRECT", "TRIG", "CHOLHDL" No  results found for: "HGBA1C" No results found for: "VITAMINB12" No results found for: "TSH"  ASSESSMENT AND PLAN 38 y.o. year old male  has a past medical history of Hypertension and Seizures ([facility]). here with:  1.  Seizures  -Last seizure was in 2015, continue Depakote  ER 500 mg BID -Routine labs were normal last visit, Depakote  level 47.  Reports PCP checked labs this year and were unremarkable.  Will reevaluate next year.  He is doing very well. -Call for seizure activity, follow-up 1 year or sooner if needed, will do video visit   Meds ordered this encounter  Medications   divalproex  (DEPAKOTE  ER)  500 MG 24 hr tablet    Sig: Take 1 tablet (500 mg total) by mouth 2 (two) times daily.    Dispense:  180 tablet    Refill:  4     Jeanmarie Millet, [place], DNP 01/28/2023, 9:25 PM [facility] [address] [place], [place] [address] [phone]

## 2023-01-29 ENCOUNTER — Telehealth: Payer: 59 | Admitting: Neurology

## 2023-01-29 DIAGNOSIS — R569 Unspecified convulsions: Secondary | ICD-10-CM

## 2023-01-29 DIAGNOSIS — G40309 Generalized idiopathic epilepsy and epileptic syndromes, not intractable, without status epilepticus: Secondary | ICD-10-CM | POA: Diagnosis not present

## 2023-01-29 MED ORDER — DIVALPROEX SODIUM ER 500 MG PO TB24: 500.0000 mg | ORAL_TABLET | Freq: Two times a day (BID) | ORAL | 4 refills | Status: DC

## 2023-01-29 NOTE — Patient Instructions (Signed)
 Great to see you today.  Please continue Depakote  for seizure prevention.  Please call for any seizure-like activity.  Please ensure your primary care doctor checks annual labs including liver function.  I will see you back in 1 year.  Thanks!!

## 2024-02-04 ENCOUNTER — Telehealth: Payer: 59 | Admitting: Neurology

## 2024-02-04 DIAGNOSIS — G40309 Generalized idiopathic epilepsy and epileptic syndromes, not intractable, without status epilepticus: Secondary | ICD-10-CM

## 2024-02-04 DIAGNOSIS — R569 Unspecified convulsions: Secondary | ICD-10-CM

## 2024-02-04 MED ORDER — DIVALPROEX SODIUM ER 500 MG PO TB24: 500.0000 mg | ORAL_TABLET | Freq: Two times a day (BID) | ORAL | 4 refills | Status: AC

## 2024-02-04 NOTE — Patient Instructions (Signed)
 Great to see you today! Continue Depakote  for seizure prevention Have primary care check Depakote  level at next visit Call for seizure activity.  Follow-up 1 year.  Thanks!!

## 2024-02-04 NOTE — Progress Notes (Signed)
 "  PATIENT: Nicholas Harvey DOB: 1985-07-31  REASON FOR VISIT: Follow up for seizures HISTORY FROM: Patient PRIMARY NEUROLOGIST: Dr. Onita  Virtual Visit via Video Note  I connected with Nicholas Harvey on 02/04/2024 at  2:15 PM EST by a video enabled telemedicine application and verified that I am speaking with the correct person using two identifiers.  Location: Patient: at work Provider: in the office    I discussed the limitations of evaluation and management by telemedicine and the availability of in person appointments. The patient expressed understanding and agreed to proceed.  HISTORY  12/06/2019 SS: [name] is a 39 year old male with history of epilepsy. Started having seizures in 2003, describes his general convulsions associated with LOC, and postictal lethargy and confusion. Last seizure was in November 2015, when he ran out of medications. MRI and EEG has been unremarkable. He remains on Depakote , tolerating well, no recurrent seizure.  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 Does report some anxiety, his mind is always working.  Needs to see PCP soon.  Presents today for evaluation via virtual visit.  No reported adverse effect from Depakote .  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 No seizures, still on Depakote . No side effects, is compliant. No new medical issues. Working on [employer]. Having trouble sleeping at night chronically. Has 52 year old daughter.   Update February 07, 2022 SS: Doing well, no recent seizures, remains on Depakote  ER 500 mg twice daily.  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  No new issues.  Update January 29, 2023 SS: Labs at last visit January 2024 showed normal CBC, CMP, Depakote  level 47. Is an assistant principal. No seizures, remains on Depakote  ER 500 mg BID. Follow with PCP for HTN, HLD, reports normal labs. Has made  healthy lifestyle changes.   Update 02/04/24 SS: VV, no seizures, remains on Depakote  ER 500 mg BID. Reviewed CBC, CMP from [place]. Needs to watch BP. Working as geophysicist/field seismologist principal in high school.   REVIEW OF SYSTEMS: Out of a complete 14 system review of symptoms, the patient complains only of the following symptoms, and all other reviewed systems are negative.  See HPI  ALLERGIES: No Known Allergies  HOME MEDICATIONS: Outpatient Medications Prior to Visit  Medication Sig Dispense Refill   amLODipine (NORVASC) 10 MG tablet Take 10 mg by mouth daily.     Multiple Vitamins-Minerals (MULTIVITAMIN & MINERAL PO) Take by mouth.     Omega-3 Fatty Acids (FISH OIL EXTRA STRENGTH PO) Take by mouth.     divalproex  (DEPAKOTE  ER) 500 MG 24 hr tablet Take 1 tablet (500 mg total) by mouth 2 (two) times daily. 180 tablet 4   No facility-administered medications prior to visit.    PAST MEDICAL HISTORY: Past Medical History:  Diagnosis Date   Hypertension    Seizures (HCC)     PAST SURGICAL HISTORY: Past Surgical History:  Procedure Laterality Date   NONE      FAMILY HISTORY: Family History  Problem Relation Age of Onset   Seizures Mother    Asthma Mother    Hypertension Mother    Emphysema Mother    Hypertension Father    Asthma Sister    Hypertension Maternal Grandmother    Kidney disease Maternal Grandmother    Hypertension Paternal Grandmother    Kidney disease Paternal Grandfather  SOCIAL HISTORY: Social History   Socioeconomic History   Marital status: Single    Spouse name: Nicholas Harvey    Number of children: 0   Years of education: Masters   Highest education level: Not on file  Occupational History   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    Patient has no children.    Patient has his Masters in education.    Patient is right handed.    Patient is currently working for [employer].          Social Drivers of Health   Tobacco Use: Low Risk (02/07/2022)   Patient History    Smoking Tobacco Use: Never    Smokeless Tobacco Use: Never    Passive Exposure: Not on file  Financial Resource Strain: Not on file  Food Insecurity: Not on file  Transportation Needs: Not on file  Physical Activity: Not on file  Stress: Not on file  Social Connections: Unknown (05/19/2021)   [facility]   Social Network    Social Network: Not on file  Intimate Partner Violence: Unknown (04/16/2021)   Received from [facility]   HITS    Physically Hurt: Not on file    Insult or Talk Down To: Not on file    Threaten Physical Harm: Not on file    Scream or Curse: Not on file  Depression (EYV7-0): Not on file  Alcohol Screen: Not on file  Housing: Not on file  Utilities: Not on file  Health Literacy: Not on file   PHYSICAL EXAM  There were no vitals filed for this visit.  There is no height or weight on file to calculate BMI.  Virtual visit  DIAGNOSTIC DATA (LABS, IMAGING, TESTING) - I reviewed patient records, labs, notes, testing and imaging myself where available.  Lab Results  Component Value Date   WBC 4.0 02/07/2022   HGB 13.5 02/07/2022   HCT 42.6 02/07/2022   MCV 87 02/07/2022   PLT 187 02/07/2022      Component Value Date/Time   NA 141 02/07/2022 1316   K 4.5 02/07/2022 1316   CL 104 02/07/2022 1316   CO2 23 02/07/2022 1316   GLUCOSE 86 02/07/2022 1316   BUN 11 02/07/2022 1316   CREATININE 1.01 02/07/2022 1316   CALCIUM 9.7 02/07/2022 1316   PROT 7.6 02/07/2022 1316   ALBUMIN  4.8 02/07/2022 1316   AST 17 02/07/2022 1316   ALT 22 02/07/2022 1316   ALKPHOS 62 02/07/2022 1316   BILITOT 0.7 02/07/2022 1316   GFRNONAA 101 09/30/2018 1519   GFRAA 116 09/30/2018 1519   No results found for: CHOL, HDL, LDLCALC, LDLDIRECT, TRIG, CHOLHDL No results found for: YHAJ8R No results found for: VITAMINB12 No results found for: TSH  ASSESSMENT AND PLAN 39 y.o. year old male  has a past medical history of Hypertension and Seizures (HCC). here with:  1.  Seizures  -Last seizure was in 2015, continue Depakote  ER 500  mg BID -Reviewed recent labs from PCP, request patient ask his PCP to check Depakote  level at next visit -Call for seizure activity, follow-up 1 year or sooner if needed, will do video visit   Meds ordered this encounter  Medications   divalproex  (DEPAKOTE  ER) 500 MG 24 hr tablet    Sig: Take 1 tablet (500 mg total) by mouth 2 (two) times daily.    Dispense:  180 tablet    Refill:  4   Lauraine Born, [place], DNP 02/04/2024, 2:56 PM [facility] [address] [place], [place] [address] [phone]  "

## 2025-02-09 ENCOUNTER — Telehealth: Admitting: Neurology
